# Patient Record
Sex: Male | Born: 1970
Health system: Southern US, Community
[De-identification: ages and names within clinical notes are randomized; demographics above are authoritative.]

## PROBLEM LIST (undated history)

## (undated) DIAGNOSIS — I10 Essential (primary) hypertension: Secondary | ICD-10-CM

## (undated) HISTORY — PX: OTHER SURGICAL HISTORY: SHX169

---

## 2011-10-02 ENCOUNTER — Emergency Department (INDEPENDENT_AMBULATORY_CARE_PROVIDER_SITE_OTHER): Payer: Self-pay

## 2011-10-02 ENCOUNTER — Emergency Department (HOSPITAL_BASED_OUTPATIENT_CLINIC_OR_DEPARTMENT_OTHER)
Admission: EM | Admit: 2011-10-02 | Discharge: 2011-10-02 | Disposition: A | Discharge status: Home / Self Care | Payer: Self-pay | Attending: Emergency Medicine | Admitting: Emergency Medicine

## 2011-10-02 ENCOUNTER — Other Ambulatory Visit: Payer: Self-pay

## 2011-10-02 ENCOUNTER — Encounter (HOSPITAL_BASED_OUTPATIENT_CLINIC_OR_DEPARTMENT_OTHER): Payer: Self-pay

## 2011-10-02 DIAGNOSIS — M79643 Pain in unspecified hand: Secondary | ICD-10-CM

## 2011-10-02 DIAGNOSIS — M25649 Stiffness of unspecified hand, not elsewhere classified: Secondary | ICD-10-CM | POA: Insufficient documentation

## 2011-10-02 DIAGNOSIS — R209 Unspecified disturbances of skin sensation: Secondary | ICD-10-CM

## 2011-10-02 DIAGNOSIS — F17200 Nicotine dependence, unspecified, uncomplicated: Secondary | ICD-10-CM | POA: Insufficient documentation

## 2011-10-02 DIAGNOSIS — M79609 Pain in unspecified limb: Secondary | ICD-10-CM | POA: Insufficient documentation

## 2011-10-02 DIAGNOSIS — R079 Chest pain, unspecified: Secondary | ICD-10-CM | POA: Insufficient documentation

## 2011-10-02 HISTORY — DX: Essential (primary) hypertension: I10

## 2011-10-02 MED ORDER — OXYCODONE-ACETAMINOPHEN 5-325 MG PO TABS: 1.0000 | ORAL_TABLET | ORAL | Status: AC | PRN

## 2011-10-02 MED ORDER — OXYCODONE-ACETAMINOPHEN 5-325 MG PO TABS
1.0000 | ORAL_TABLET | Freq: Once | ORAL | Status: AC
  Administered 2011-10-02: 1 via ORAL
  Filled 2011-10-02: qty 1

## 2011-10-02 NOTE — ED Notes (Signed)
EKG given to Dr. Deretha Emory; per MD patient moved to lobby to wait.

## 2011-10-02 NOTE — ED Notes (Signed)
C/o CP with numbness to both hands "for a while" approx 1 month

## 2011-10-02 NOTE — ED Provider Notes (Signed)
This chart was scribed for Austin Gaskins, MD by Wallis Mart. The patient was seen in room MH08/MH08 and the patient's care was started at 3:18 PM.   CSN: [id number]  Arrival date & time 10/02/11  1341   First MD Initiated Contact with Patient 10/02/11 1504      Chief Complaint  Patient presents with  . Numbness  . Chest Pain     HPI Austin Mcgee is a 41 y.o. male who presents to the Emergency Department complaining persistence of constant, gradually worsening, moderate chest pain onset a week ago. The pain radiates nowhere.  Pt states that lying down improves the cp.  Waking up worsens the cp.  Pt also complains that his fingers on both hands are numb and stiff when he wakes up.  Denies fevers, SOB, abdominal pain.  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 Denies numbness and tingling in feet.  There are no other associated symptoms and no other alleviating or aggravating factors.  CP is not pleuritic   Past Medical History  Diagnosis Date  . Hypertension     History reviewed. No pertinent past surgical history.  History reviewed. No pertinent family history.  History  Substance Use Topics  . Smoking status: Current Everyday Smoker  . Smokeless tobacco: Not on file  . Alcohol Use: Yes      Review of Systems  Constitutional: Positive for diaphoresis. Negative for fever and chills.  HENT: Negative for rhinorrhea and neck pain.   Eyes: Negative for pain.  Respiratory: Negative for cough and shortness of breath.   Cardiovascular: Positive for chest pain.  Gastrointestinal: Negative for nausea, vomiting, abdominal pain and diarrhea.  Genitourinary: Negative for dysuria.  Musculoskeletal: Negative for back pain.  Skin: Negative for rash.  Neurological: Positive for numbness. Negative for dizziness and weakness.   10 Systems reviewed and are negative for acute change except as noted in the HPI.  Allergies  Review of patient's allergies indicates no known  allergies.  Home Medications  No current outpatient prescriptions on file.  BP 140/99  Pulse 63  Temp(Src) 98.4 F (36.9 C) (Oral)  Resp 20  Ht 5\' 7"  (1.702 m)  Wt 200 lb (90.719 kg)  BMI 31.32 kg/m2  SpO2 99%  Physical Exam CONSTITUTIONAL: Well developed/well nourished HEAD AND FACE: Normocephalic/atraumatic EYES: EOMI/PERRL ENMT: Mucous membranes moist NECK: supple no meningeal signs SPINE:entire spine nontender CV: S1/S2 noted, no murmurs/rubs/gallops noted LUNGS: Lungs are clear to auscultation bilaterally, no apparent distress ABDOMEN: soft, nontender, no rebound or guarding GU:no cva tenderness NEURO: Pt is awake/alert, moves all extremitiesx4 EXTREMITIES: pulses normal, full ROM, pain and numbness in both hands  worsened by hyperflexion of each wrist, distal cap refill brisk in both hands No erythema/abscess/edema noted to any of the extremities SKIN: warm, color normal PSYCH: no abnormalities of mood noted  ED Course  Procedures  DIAGNOSTIC STUDIES: Oxygen Saturation is 95% on room air, adequate by my interpretation.    COORDINATION OF CARE:   3:59 PM For hand numbness, likely work related, will give hand referral, as pain/numbness reproduced on exam with hyperflexion of each wrist For CP, doubt ACS/PE/Dissection at this time Advised close f/u for HTN Doubt myocarditis/pericarditis at this time  4:10 PM: EDP at bedside. All results reviewed and discussed with pt, questions answered, pt agreeable with plan.    MDM  Nursing notes reviewed and considered in documentation xrays reviewed and considered     Date: 10/02/2011  Rate: 59  Rhythm: normal sinus rhythm  QRS Axis: normal  Intervals: normal  ST/T Wave abnormalities: normal  Conduction Disutrbances:none  Narrative Interpretation:   Old EKG Reviewed: none available    I personally performed the services described in this documentation, which was scribed in my presence. The recorded  information has been reviewed and considered.         Austin Gaskins, MD 10/02/11 [phone]

## 2011-10-02 NOTE — Discharge Instructions (Signed)

## 2019-11-16 ENCOUNTER — Encounter (HOSPITAL_COMMUNITY): Payer: Self-pay | Admitting: Emergency Medicine

## 2019-11-16 ENCOUNTER — Inpatient Hospital Stay (HOSPITAL_COMMUNITY): Payer: Self-pay

## 2019-11-16 ENCOUNTER — Inpatient Hospital Stay (HOSPITAL_COMMUNITY)
Admission: EM | Admit: 2019-11-16 | Discharge: 2019-11-18 | DRG: 280 | Discharge status: Against Medical Advice | Payer: Self-pay | Attending: Family Medicine | Admitting: Family Medicine

## 2019-11-16 ENCOUNTER — Other Ambulatory Visit: Payer: Self-pay

## 2019-11-16 ENCOUNTER — Emergency Department (HOSPITAL_COMMUNITY): Payer: Self-pay

## 2019-11-16 DIAGNOSIS — I428 Other cardiomyopathies: Secondary | ICD-10-CM | POA: Diagnosis present

## 2019-11-16 DIAGNOSIS — T465X6A Underdosing of other antihypertensive drugs, initial encounter: Secondary | ICD-10-CM | POA: Diagnosis present

## 2019-11-16 DIAGNOSIS — F1020 Alcohol dependence, uncomplicated: Secondary | ICD-10-CM | POA: Diagnosis present

## 2019-11-16 DIAGNOSIS — T588X1A Toxic effect of carbon monoxide from other source, accidental (unintentional), initial encounter: Secondary | ICD-10-CM | POA: Diagnosis present

## 2019-11-16 DIAGNOSIS — I214 Non-ST elevation (NSTEMI) myocardial infarction: Principal | ICD-10-CM | POA: Diagnosis present

## 2019-11-16 DIAGNOSIS — Z8249 Family history of ischemic heart disease and other diseases of the circulatory system: Secondary | ICD-10-CM

## 2019-11-16 DIAGNOSIS — R0602 Shortness of breath: Secondary | ICD-10-CM

## 2019-11-16 DIAGNOSIS — I509 Heart failure, unspecified: Secondary | ICD-10-CM

## 2019-11-16 DIAGNOSIS — R0682 Tachypnea, not elsewhere classified: Secondary | ICD-10-CM | POA: Diagnosis present

## 2019-11-16 DIAGNOSIS — I110 Hypertensive heart disease with heart failure: Secondary | ICD-10-CM | POA: Diagnosis present

## 2019-11-16 DIAGNOSIS — F17200 Nicotine dependence, unspecified, uncomplicated: Secondary | ICD-10-CM | POA: Diagnosis present

## 2019-11-16 DIAGNOSIS — I10 Essential (primary) hypertension: Secondary | ICD-10-CM | POA: Diagnosis present

## 2019-11-16 DIAGNOSIS — Z720 Tobacco use: Secondary | ICD-10-CM

## 2019-11-16 DIAGNOSIS — R778 Other specified abnormalities of plasma proteins: Secondary | ICD-10-CM

## 2019-11-16 DIAGNOSIS — I5021 Acute systolic (congestive) heart failure: Secondary | ICD-10-CM | POA: Diagnosis present

## 2019-11-16 DIAGNOSIS — Y92009 Unspecified place in unspecified non-institutional (private) residence as the place of occurrence of the external cause: Secondary | ICD-10-CM

## 2019-11-16 DIAGNOSIS — Z7982 Long term (current) use of aspirin: Secondary | ICD-10-CM

## 2019-11-16 DIAGNOSIS — F17210 Nicotine dependence, cigarettes, uncomplicated: Secondary | ICD-10-CM | POA: Diagnosis present

## 2019-11-16 DIAGNOSIS — Z9114 Patient's other noncompliance with medication regimen: Secondary | ICD-10-CM

## 2019-11-16 DIAGNOSIS — Z20822 Contact with and (suspected) exposure to covid-19: Secondary | ICD-10-CM | POA: Diagnosis present

## 2019-11-16 DIAGNOSIS — Z5329 Procedure and treatment not carried out because of patient's decision for other reasons: Secondary | ICD-10-CM | POA: Diagnosis present

## 2019-11-16 DIAGNOSIS — Z91128 Patient's intentional underdosing of medication regimen for other reason: Secondary | ICD-10-CM

## 2019-11-16 DIAGNOSIS — F1410 Cocaine abuse, uncomplicated: Secondary | ICD-10-CM | POA: Diagnosis present

## 2019-11-16 DIAGNOSIS — E119 Type 2 diabetes mellitus without complications: Secondary | ICD-10-CM | POA: Diagnosis present

## 2019-11-16 LAB — RAPID URINE DRUG SCREEN, HOSP PERFORMED
Amphetamines: NOT DETECTED
Barbiturates: NOT DETECTED
Benzodiazepines: NOT DETECTED
Cocaine: POSITIVE — AB
Opiates: NOT DETECTED
Tetrahydrocannabinol: NOT DETECTED

## 2019-11-16 LAB — CBC
HCT: 39.7 % (ref 39.0–52.0)
Hemoglobin: 13 g/dL (ref 13.0–17.0)
MCH: 29.1 pg (ref 26.0–34.0)
MCHC: 32.7 g/dL (ref 30.0–36.0)
MCV: 88.8 fL (ref 80.0–100.0)
Platelets: 346 10*3/uL (ref 150–400)
RBC: 4.47 MIL/uL (ref 4.22–5.81)
RDW: 15.1 % (ref 11.5–15.5)
WBC: 11.9 10*3/uL — ABNORMAL HIGH (ref 4.0–10.5)
nRBC: 0 % (ref 0.0–0.2)

## 2019-11-16 LAB — BASIC METABOLIC PANEL
Anion gap: 14 (ref 5–15)
BUN: 33 mg/dL — ABNORMAL HIGH (ref 6–20)
CO2: 21 mmol/L — ABNORMAL LOW (ref 22–32)
Calcium: 10 mg/dL (ref 8.9–10.3)
Chloride: 99 mmol/L (ref 98–111)
Creatinine, Ser: 1.42 mg/dL — ABNORMAL HIGH (ref 0.61–1.24)
GFR calc Af Amer: >60 mL/min (ref >60)
GFR calc non Af Amer: 58 mL/min — ABNORMAL LOW (ref >60)
Glucose, Bld: 123 mg/dL — ABNORMAL HIGH (ref 70–99)
Potassium: 5 mmol/L (ref 3.5–5.1)
Sodium: 134 mmol/L — ABNORMAL LOW (ref 135–145)

## 2019-11-16 LAB — URINALYSIS, ROUTINE W REFLEX MICROSCOPIC
Bilirubin Urine: NEGATIVE
Glucose, UA: NEGATIVE mg/dL
Hgb urine dipstick: NEGATIVE
Ketones, ur: NEGATIVE mg/dL
Leukocytes,Ua: NEGATIVE
Nitrite: NEGATIVE
Protein, ur: NEGATIVE mg/dL
Specific Gravity, Urine: 1.005 (ref 1.005–1.030)
pH: 6 (ref 5.0–8.0)

## 2019-11-16 LAB — POCT I-STAT 7, (LYTES, BLD GAS, ICA,H+H)
Acid-Base Excess: 4 mmol/L — ABNORMAL HIGH (ref 0.0–2.0)
Bicarbonate: 24.8 mmol/L (ref 20.0–28.0)
Calcium, Ion: 1.2 mmol/L (ref 1.15–1.40)
HCT: 39 % (ref 39.0–52.0)
Hemoglobin: 13.3 g/dL (ref 13.0–17.0)
O2 Saturation: 93 %
Patient temperature: 97.8
Potassium: 3.8 mmol/L (ref 3.5–5.1)
Sodium: 136 mmol/L (ref 135–145)
TCO2: 26 mmol/L (ref 22–32)
pCO2 arterial: 24.5 mmHg — ABNORMAL LOW (ref 32.0–48.0)
pH, Arterial: 7.613 (ref 7.350–7.450)
pO2, Arterial: 51 mmHg — ABNORMAL LOW (ref 83.0–108.0)

## 2019-11-16 LAB — TROPONIN I (HIGH SENSITIVITY)
Troponin I (High Sensitivity): 400 ng/L (ref <18)
Troponin I (High Sensitivity): 404 ng/L (ref <18)

## 2019-11-16 LAB — ECHOCARDIOGRAM COMPLETE
Height: 67.5 in
Weight: 2880 oz

## 2019-11-16 LAB — RESPIRATORY PANEL BY RT PCR (FLU A&B, COVID)
Influenza A by PCR: NEGATIVE
Influenza B by PCR: NEGATIVE
SARS Coronavirus 2 by RT PCR: NEGATIVE

## 2019-11-16 LAB — BRAIN NATRIURETIC PEPTIDE: B Natriuretic Peptide: 566.4 pg/mL — ABNORMAL HIGH (ref 0.0–100.0)

## 2019-11-16 LAB — CARBOXYHEMOGLOBIN - COOX: Carboxyhemoglobin: 4.7 % — ABNORMAL HIGH (ref 0.5–1.5)

## 2019-11-16 MED ORDER — ASPIRIN 81 MG PO CHEW
324.0000 mg | CHEWABLE_TABLET | Freq: Once | ORAL | Status: AC
  Administered 2019-11-16: 11:00:00 324 mg via ORAL
  Filled 2019-11-16: qty 4

## 2019-11-16 MED ORDER — LORAZEPAM 2 MG/ML IJ SOLN: 1.0000 mg | INTRAMUSCULAR | Status: DC | PRN

## 2019-11-16 MED ORDER — ATORVASTATIN CALCIUM 40 MG PO TABS
40.0000 mg | ORAL_TABLET | Freq: Every day | ORAL | Status: DC
  Administered 2019-11-16: 40 mg via ORAL
  Filled 2019-11-16: qty 1

## 2019-11-16 MED ORDER — FOLIC ACID 1 MG PO TABS
1.0000 mg | ORAL_TABLET | Freq: Every day | ORAL | Status: DC
  Administered 2019-11-16 – 2019-11-17 (×2): 1 mg via ORAL
  Filled 2019-11-16 (×2): qty 1

## 2019-11-16 MED ORDER — BISACODYL 5 MG PO TBEC: 5.0000 mg | DELAYED_RELEASE_TABLET | Freq: Every day | ORAL | Status: DC | PRN

## 2019-11-16 MED ORDER — THIAMINE HCL 100 MG/ML IJ SOLN: 100.0000 mg | Freq: Every day | INTRAMUSCULAR | Status: DC

## 2019-11-16 MED ORDER — ONDANSETRON HCL 4 MG/2ML IJ SOLN: 4.0000 mg | Freq: Four times a day (QID) | INTRAMUSCULAR | Status: DC | PRN

## 2019-11-16 MED ORDER — NITROGLYCERIN 0.4 MG SL SUBL: 0.4000 mg | SUBLINGUAL_TABLET | SUBLINGUAL | Status: DC | PRN

## 2019-11-16 MED ORDER — SODIUM CHLORIDE 0.9% FLUSH: 3.0000 mL | Freq: Once | INTRAVENOUS | Status: DC

## 2019-11-16 MED ORDER — SODIUM CHLORIDE 0.9 % IV SOLN: 250.0000 mL | INTRAVENOUS | Status: DC | PRN

## 2019-11-16 MED ORDER — THIAMINE HCL 100 MG PO TABS
100.0000 mg | ORAL_TABLET | Freq: Every day | ORAL | Status: DC
  Administered 2019-11-16: 100 mg via ORAL
  Filled 2019-11-16: qty 1

## 2019-11-16 MED ORDER — SODIUM CHLORIDE 0.9% FLUSH: 3.0000 mL | Freq: Two times a day (BID) | INTRAVENOUS | Status: DC

## 2019-11-16 MED ORDER — HEPARIN (PORCINE) 25000 UT/250ML-% IV SOLN
1450.0000 [IU]/h | INTRAVENOUS | Status: DC
  Administered 2019-11-16: 1000 [IU]/h via INTRAVENOUS
  Administered 2019-11-17: 1300 [IU]/h via INTRAVENOUS
  Filled 2019-11-16 (×2): qty 250

## 2019-11-16 MED ORDER — HEPARIN BOLUS VIA INFUSION
4000.0000 [IU] | Freq: Once | INTRAVENOUS | Status: AC
  Administered 2019-11-16: 16:00:00 4000 [IU] via INTRAVENOUS
  Filled 2019-11-16: qty 4000

## 2019-11-16 MED ORDER — ONDANSETRON HCL 4 MG PO TABS: 4.0000 mg | ORAL_TABLET | Freq: Four times a day (QID) | ORAL | Status: DC | PRN

## 2019-11-16 MED ORDER — MORPHINE SULFATE (PF) 2 MG/ML IV SOLN: 2.0000 mg | INTRAVENOUS | Status: DC | PRN

## 2019-11-16 MED ORDER — HYDRALAZINE HCL 20 MG/ML IJ SOLN: 5.0000 mg | INTRAMUSCULAR | Status: DC | PRN

## 2019-11-16 MED ORDER — SODIUM CHLORIDE 0.9% FLUSH: 3.0000 mL | INTRAVENOUS | Status: DC | PRN

## 2019-11-16 MED ORDER — ACETAMINOPHEN 650 MG RE SUPP: 650.0000 mg | Freq: Four times a day (QID) | RECTAL | Status: DC | PRN

## 2019-11-16 MED ORDER — LORAZEPAM 2 MG/ML IJ SOLN
0.5000 mg | Freq: Once | INTRAMUSCULAR | Status: AC
  Administered 2019-11-16: 0.5 mg via INTRAVENOUS
  Filled 2019-11-16: qty 1

## 2019-11-16 MED ORDER — FUROSEMIDE 10 MG/ML IJ SOLN
40.0000 mg | Freq: Once | INTRAMUSCULAR | Status: AC
  Administered 2019-11-16: 40 mg via INTRAVENOUS
  Filled 2019-11-16: qty 4

## 2019-11-16 MED ORDER — ASPIRIN EC 81 MG PO TBEC
81.0000 mg | DELAYED_RELEASE_TABLET | Freq: Every day | ORAL | Status: DC
  Administered 2019-11-17: 81 mg via ORAL
  Filled 2019-11-16: qty 1

## 2019-11-16 MED ORDER — NICOTINE 14 MG/24HR TD PT24
14.0000 mg | MEDICATED_PATCH | Freq: Every day | TRANSDERMAL | Status: DC
  Administered 2019-11-17: 13:00:00 14 mg via TRANSDERMAL
  Filled 2019-11-16: qty 1

## 2019-11-16 MED ORDER — POLYETHYLENE GLYCOL 3350 17 G PO PACK: 17.0000 g | PACK | Freq: Every day | ORAL | Status: DC | PRN

## 2019-11-16 MED ORDER — ACETAMINOPHEN 325 MG PO TABS: 650.0000 mg | ORAL_TABLET | Freq: Four times a day (QID) | ORAL | Status: DC | PRN

## 2019-11-16 MED ORDER — LORAZEPAM 1 MG PO TABS
1.0000 mg | ORAL_TABLET | ORAL | Status: DC | PRN
  Administered 2019-11-17: 1 mg via ORAL
  Administered 2019-11-18: 2 mg via ORAL
  Filled 2019-11-16: qty 2
  Filled 2019-11-16: qty 1

## 2019-11-16 MED ORDER — DOCUSATE SODIUM 100 MG PO CAPS: 100.0000 mg | ORAL_CAPSULE | Freq: Two times a day (BID) | ORAL | Status: DC

## 2019-11-16 MED ORDER — HYDROCODONE-ACETAMINOPHEN 5-325 MG PO TABS
1.0000 | ORAL_TABLET | ORAL | Status: DC | PRN
  Administered 2019-11-17 – 2019-11-18 (×2): 2 via ORAL
  Filled 2019-11-16 (×2): qty 2

## 2019-11-16 MED ORDER — ADULT MULTIVITAMIN W/MINERALS CH
1.0000 | ORAL_TABLET | Freq: Every day | ORAL | Status: DC
  Administered 2019-11-16 – 2019-11-17 (×2): 1 via ORAL
  Filled 2019-11-16 (×2): qty 1

## 2019-11-16 NOTE — ED Provider Notes (Signed)
[facility] EMERGENCY DEPARTMENT Provider Note   CSN: [id number] Arrival date & time: 11/16/19  5035     History Chief Complaint  Patient presents with  . Shortness of Breath  . Chest Pain    Austin Mcgee is a 49 y.o. male.  Pt presents to the ED today with sob and cp.  The pt said sx started last night.  The pt said he's been painting his house.  He has been very anxious.  Pt does not have a CAD hx.  He does have htn, but does not take meds.  He does smoke.  No covid exposures.  No covid vaccine.         Past Medical History:  Diagnosis Date  . Hypertension     Patient Active Problem List   Diagnosis Date Noted  . NSTEMI (non-ST elevated myocardial infarction) (HCC) 11/16/2019    History reviewed. No pertinent surgical history.     No family history on file.  Social History   Tobacco Use  . Smoking status: Current Every Day Smoker  Substance Use Topics  . Alcohol use: Yes  . Drug use: Yes    Types: Marijuana    Home Medications Prior to Admission medications   Medication Sig Start Date End Date Taking? Authorizing Provider  aspirin 325 MG tablet Take 325 mg by mouth daily. Patient used this medication for pain.    [provider]  doxylamine, Sleep, (UNISOM) 25 MG tablet Take 25 mg by mouth at bedtime as needed. Patient used this medication to aid with sleeping.    [provider]    Allergies    Patient has no known allergies.  Review of Systems   Review of Systems  Respiratory: Positive for shortness of breath.   Cardiovascular: Positive for chest pain.  All other systems reviewed and are negative.   Physical Exam Updated Vital Signs BP (!) 121/106 (BP Location: Right Arm) Comment: pt refused to keep arm still  Pulse 98   Temp 97.8 F (36.6 C) (Axillary)   Resp (!) 25   Ht 5' 7.5" (1.715 m)   Wt 81.6 kg   SpO2 99%   BMI 27.78 kg/m   Physical Exam Vitals and nursing note reviewed.    Constitutional:      Appearance: He is well-developed.  HENT:     Head: Normocephalic and atraumatic.     Mouth/Throat:     Mouth: Mucous membranes are moist.     Pharynx: Oropharynx is clear.  Eyes:     Extraocular Movements: Extraocular movements intact.     Pupils: Pupils are equal, round, and reactive to light.  Cardiovascular:     Rate and Rhythm: Normal rate and regular rhythm.  Pulmonary:     Effort: Tachypnea present.     Breath sounds: Rhonchi present.  Abdominal:     General: Bowel sounds are normal.     Palpations: Abdomen is soft.  Musculoskeletal:     Cervical back: Normal range of motion and neck supple.     Right lower leg: Edema present.     Left lower leg: Edema present.  Skin:    General: Skin is warm.     Capillary Refill: Capillary refill takes less than 2 seconds.  Neurological:     General: No focal deficit present.     Mental Status: He is alert and oriented to person, place, and time.  Psychiatric:        Mood and Affect:  Mood is anxious.        Behavior: Behavior normal.     ED Results / Procedures / Treatments   Labs (all labs ordered are listed, but only abnormal results are displayed) Labs Reviewed  BASIC METABOLIC PANEL - Abnormal; Notable for the following components:      Result Value   Sodium 134 (*)    CO2 21 (*)    Glucose, Bld 123 (*)    BUN 33 (*)    Creatinine, Ser 1.42 (*)    GFR calc non Af Amer 58 (*)    All other components within normal limits  CBC - Abnormal; Notable for the following components:   WBC 11.9 (*)    All other components within normal limits  BRAIN NATRIURETIC PEPTIDE - Abnormal; Notable for the following components:   B Natriuretic Peptide 566.4 (*)    All other components within normal limits  TROPONIN I (HIGH SENSITIVITY) - Abnormal; Notable for the following components:   Troponin I (High Sensitivity) 400 (*)    All other components within normal limits  TROPONIN I (HIGH SENSITIVITY) - Abnormal;  Notable for the following components:   Troponin I (High Sensitivity) 404 (*)    All other components within normal limits  RESPIRATORY PANEL BY RT PCR (FLU A&B, COVID)  RAPID URINE DRUG SCREEN, HOSP PERFORMED  URINALYSIS, ROUTINE W REFLEX MICROSCOPIC    EKG EKG Interpretation  Date/Time:  Sunday November 16 2019 08:55:15 EDT Ventricular Rate:  84 PR Interval:  150 QRS Duration: 98 QT Interval:  402 QTC Calculation: 475 R Axis:   80 Text Interpretation: Normal sinus rhythm Minimal voltage criteria for LVH, may be normal variant ( Sokolow-Lyon ) Borderline ECG No significant change since last tracing Confirmed by Jacalyn Lefevre [phone]) on 11/16/2019 10:46:37 AM   Radiology DG Chest 2 View  Result Date: 11/16/2019 CLINICAL DATA:  Chest pain, shortness of breath for 1 hour. EXAM: CHEST - 2 VIEW COMPARISON:  01/12/2019 FINDINGS: Cardiomediastinal contours are enlarged. Central pulmonary vascular engorgement. Increased interstitial markings bilaterally. Subtle basilar opacities bilaterally. No signs of pleural effusion. Visualized skeletal structures are unremarkable. IMPRESSION: Findings suspicious for heart failure or volume overload. Patchy basilar opacities may represent atelectasis or developing infection. Electronically Signed   By: Donzetta Kohut M.D.   On: 11/16/2019 10:11    Procedures Procedures (including critical care time)  Medications Ordered in ED Medications  sodium chloride flush (NS) 0.9 % injection 3 mL (3 mLs Intravenous Not Given 11/16/19 1121)  nitroGLYCERIN (NITROSTAT) SL tablet 0.4 mg (has no administration in time range)  aspirin chewable tablet 324 mg (324 mg Oral Given 11/16/19 1121)  furosemide (LASIX) injection 40 mg (40 mg Intravenous Given 11/16/19 1130)  LORazepam (ATIVAN) injection 0.5 mg (0.5 mg Intravenous Given 11/16/19 1128)    ED Course  I have reviewed the triage vital signs and the nursing notes.  Pertinent labs & imaging results that were  available during my care of the patient were reviewed by me and considered in my medical decision making (see chart for details).    MDM Rules/Calculators/A&P                     CP is gone after the nitro sl.  Pt is much more calm after the ativan.  Lasix has helped the sob.  No hx of CHF.  Noncompliant with bp meds due to no pcp.  Pt encouraged to stop smoking.  Pt d/w Dr. Ladona Ridgel (  cards) who said pt does not need an emergent cath.  He will see pt in consult.  1st troponin is elevated, but the 2nd troponin is not going up.  It is stable.  Pt d/w Dr. Lorin Mercy (triad) for admission.  CRITICAL CARE Performed by: Isla Pence   Total critical care time: 30 minutes  Critical care time was exclusive of separately billable procedures and treating other patients.  Critical care was necessary to treat or prevent imminent or life-threatening deterioration.  Critical care was time spent personally by me on the following activities: development of treatment plan with patient and/or surrogate as well as nursing, discussions with consultants, evaluation of patient's response to treatment, examination of patient, obtaining history from patient or surrogate, ordering and performing treatments and interventions, ordering and review of laboratory studies, ordering and review of radiographic studies, pulse oximetry and re-evaluation of patient's condition.  Final Clinical Impression(s) / ED Diagnoses Final diagnoses:  Acute congestive heart failure, unspecified heart failure type ([place])  Essential hypertension  Tobacco abuse  Elevated troponin    Rx / DC Orders ED Discharge Orders    None       Isla Pence, MD 11/16/19 1433

## 2019-11-16 NOTE — ED Notes (Signed)
Spouse remains at bedside.  

## 2019-11-16 NOTE — ED Notes (Signed)
Spouse talking w/Dr Ophelia Charter on phone.
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
## 2019-11-16 NOTE — Progress Notes (Signed)
  Echocardiogram 2D Echocardiogram has been performed.  Austin Mcgee 11/16/2019, 3:36 PM

## 2019-11-16 NOTE — Progress Notes (Signed)
ANTICOAGULATION CONSULT NOTE - Initial Consult  Pharmacy Consult for heparin Indication: chest pain/ACS  No Known Allergies  Patient Measurements: Height: 5' 7.5" (171.5 cm) Weight: 81.6 kg (180 lb) IBW/kg (Calculated) : 67.25  Vital Signs: Temp: 97.8 F (36.6 C) (04/25 0903) Temp Source: Axillary (04/25 0903) BP: 121/106 (04/25 0903) Pulse Rate: 98 (04/25 0903)  Labs: Recent Labs    11/16/19 0913 11/16/19 1140  HGB 13.0  --   HCT 39.7  --   PLT 346  --   CREATININE 1.42*  --   TROPONINIHS 400* 404*    Estimated Creatinine Clearance: 65 mL/min (A) (by C-G formula based on SCr of 1.42 mg/dL (H)).   Medical History: Past Medical History:  Diagnosis Date  . Hypertension    no medication in years, has not seen a doctor in years    Medications:  See med hx  Assessment: 39 yom who presented to the ED with chest pain. Troponin 400 and 404. SCr elevated at 1.42 (unknown baseline). Pharmacy consulted to begin heparin drip. No bleeding noted, CBC is normal. No AC PTA.  Goal of Therapy:  Heparin level 0.3-0.7 units/ml Monitor platelets by anticoagulation protocol: Yes   Plan:  Heparin 4000 units IV bolus then infuse at 1000 units/hr 6h heparin level Daily heparin level and CBC Monitor for s/sx of bleeding  Thank you for involving pharmacy in this patient's care.  Loura Back, PharmD, BCPS Clinical Pharmacist Clinical phone for 11/16/2019 until 3p is [phone] 11/16/2019 3:10 PM  **Pharmacist phone directory can be found on [email or web address] listed under [facility]**

## 2019-11-16 NOTE — ED Triage Notes (Signed)
Pt to triage via [facility].  Pt hyperventilating on arrival. Reports SOB x 1 hour.  He has been painting his house the last few days.  Also reports SOB last night that resolved.  Pt reports tightness to center of chest.  Pt extremely anxious.  Encouraged pt to concentrate on slowing breathing down.  O2 100% on arrival.

## 2019-11-16 NOTE — ED Notes (Signed)
Pt's spouse has arrived and is asking to see pt's dr - Message sent to Dr Ophelia Charter w/her cell number.

## 2019-11-16 NOTE — ED Notes (Signed)
Charge RN and Dr. Particia Nearing notified of Trop 400.  Pt in waiting room and being moved to treatment room.
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
## 2019-11-16 NOTE — ED Notes (Signed)
Per Dr Ophelia Charter, pt w/carbon monoxide poisoning - Non-rebreather applied. Pt aware must wear continuously until advised may remove.

## 2019-11-17 ENCOUNTER — Encounter (HOSPITAL_COMMUNITY)
Admission: EM | Discharge status: Against Medical Advice | Payer: Self-pay | Source: Home / Self Care | Attending: Family Medicine

## 2019-11-17 DIAGNOSIS — F1410 Cocaine abuse, uncomplicated: Secondary | ICD-10-CM

## 2019-11-17 DIAGNOSIS — I5021 Acute systolic (congestive) heart failure: Secondary | ICD-10-CM

## 2019-11-17 HISTORY — PX: LEFT HEART CATH AND CORONARY ANGIOGRAPHY: CATH118249

## 2019-11-17 LAB — BASIC METABOLIC PANEL
Anion gap: 11 (ref 5–15)
BUN: 26 mg/dL — ABNORMAL HIGH (ref 6–20)
CO2: 24 mmol/L (ref 22–32)
Calcium: 9.3 mg/dL (ref 8.9–10.3)
Chloride: 101 mmol/L (ref 98–111)
Creatinine, Ser: 1.42 mg/dL — ABNORMAL HIGH (ref 0.61–1.24)
GFR calc Af Amer: >60 mL/min (ref >60)
GFR calc non Af Amer: 58 mL/min — ABNORMAL LOW (ref >60)
Glucose, Bld: 195 mg/dL — ABNORMAL HIGH (ref 70–99)
Potassium: 3.7 mmol/L (ref 3.5–5.1)
Sodium: 136 mmol/L (ref 135–145)

## 2019-11-17 LAB — LIPID PANEL
Cholesterol: 156 mg/dL (ref 0–200)
HDL: 31 mg/dL — ABNORMAL LOW (ref >40)
LDL Cholesterol: 110 mg/dL — ABNORMAL HIGH (ref 0–99)
Total CHOL/HDL Ratio: 5 RATIO
Triglycerides: 74 mg/dL (ref <150)
VLDL: 15 mg/dL (ref 0–40)

## 2019-11-17 LAB — TSH: TSH: 1.109 u[IU]/mL (ref 0.350–4.500)

## 2019-11-17 LAB — CBC
HCT: 39.6 % (ref 39.0–52.0)
Hemoglobin: 13 g/dL (ref 13.0–17.0)
MCH: 29.4 pg (ref 26.0–34.0)
MCHC: 32.8 g/dL (ref 30.0–36.0)
MCV: 89.6 fL (ref 80.0–100.0)
Platelets: 318 10*3/uL (ref 150–400)
RBC: 4.42 MIL/uL (ref 4.22–5.81)
RDW: 15 % (ref 11.5–15.5)
WBC: 7 10*3/uL (ref 4.0–10.5)
nRBC: 0 % (ref 0.0–0.2)

## 2019-11-17 LAB — HEPARIN LEVEL (UNFRACTIONATED)
Heparin Unfractionated: <0.1 IU/mL — ABNORMAL LOW (ref 0.30–0.70)
Heparin Unfractionated: 0.14 IU/mL — ABNORMAL LOW (ref 0.30–0.70)

## 2019-11-17 LAB — PROTIME-INR
INR: 1.2 (ref 0.8–1.2)
Prothrombin Time: 15.2 seconds (ref 11.4–15.2)

## 2019-11-17 LAB — HEMOGLOBIN A1C
Hgb A1c MFr Bld: 6.6 % — ABNORMAL HIGH (ref 4.8–5.6)
Mean Plasma Glucose: 142.72 mg/dL

## 2019-11-17 LAB — HIV ANTIBODY (ROUTINE TESTING W REFLEX): HIV Screen 4th Generation wRfx: NONREACTIVE

## 2019-11-17 LAB — GLUCOSE, CAPILLARY: Glucose-Capillary: 145 mg/dL — ABNORMAL HIGH (ref 70–99)

## 2019-11-17 SURGERY — LEFT HEART CATH AND CORONARY ANGIOGRAPHY
Anesthesia: LOCAL

## 2019-11-17 MED ORDER — SODIUM CHLORIDE 0.9 % IV SOLN: INTRAVENOUS | Status: AC

## 2019-11-17 MED ORDER — HEPARIN SODIUM (PORCINE) 1000 UNIT/ML IJ SOLN
INTRAMUSCULAR | Status: DC | PRN
  Administered 2019-11-17: 4000 [IU] via INTRAVENOUS

## 2019-11-17 MED ORDER — VERAPAMIL HCL 2.5 MG/ML IV SOLN: INTRA_ARTERIAL | Status: DC | PRN

## 2019-11-17 MED ORDER — FENTANYL CITRATE (PF) 100 MCG/2ML IJ SOLN
INTRAMUSCULAR | Status: DC | PRN
  Administered 2019-11-17: 25 ug via INTRAVENOUS

## 2019-11-17 MED ORDER — MIDAZOLAM HCL 2 MG/2ML IJ SOLN
INTRAMUSCULAR | Status: AC
  Filled 2019-11-17: qty 2

## 2019-11-17 MED ORDER — HEPARIN (PORCINE) IN NACL 1000-0.9 UT/500ML-% IV SOLN
INTRAVENOUS | Status: AC
  Filled 2019-11-17: qty 1000

## 2019-11-17 MED ORDER — ASPIRIN 81 MG PO CHEW: 81.0000 mg | CHEWABLE_TABLET | ORAL | Status: DC

## 2019-11-17 MED ORDER — SODIUM CHLORIDE 0.9 % IV SOLN: 250.0000 mL | INTRAVENOUS | Status: DC | PRN

## 2019-11-17 MED ORDER — HEPARIN BOLUS VIA INFUSION
2000.0000 [IU] | Freq: Once | INTRAVENOUS | Status: AC
  Administered 2019-11-17: 16:00:00 2000 [IU] via INTRAVENOUS
  Filled 2019-11-17: qty 2000

## 2019-11-17 MED ORDER — HEPARIN (PORCINE) IN NACL 1000-0.9 UT/500ML-% IV SOLN
INTRAVENOUS | Status: DC | PRN
  Administered 2019-11-17 (×2): 500 mL

## 2019-11-17 MED ORDER — HYDRALAZINE HCL 20 MG/ML IJ SOLN: 10.0000 mg | INTRAMUSCULAR | Status: AC | PRN

## 2019-11-17 MED ORDER — HEPARIN SODIUM (PORCINE) 1000 UNIT/ML IJ SOLN
INTRAMUSCULAR | Status: AC
  Filled 2019-11-17: qty 1

## 2019-11-17 MED ORDER — SODIUM CHLORIDE 0.9 % IV SOLN: INTRAVENOUS | Status: DC

## 2019-11-17 MED ORDER — LIDOCAINE HCL (PF) 1 % IJ SOLN
INTRAMUSCULAR | Status: AC
  Filled 2019-11-17: qty 30

## 2019-11-17 MED ORDER — HEPARIN BOLUS VIA INFUSION
2000.0000 [IU] | Freq: Once | INTRAVENOUS | Status: AC
  Administered 2019-11-17: 2000 [IU] via INTRAVENOUS
  Filled 2019-11-17: qty 2000

## 2019-11-17 MED ORDER — SODIUM CHLORIDE 0.9% FLUSH: 3.0000 mL | Freq: Two times a day (BID) | INTRAVENOUS | Status: DC

## 2019-11-17 MED ORDER — NITROGLYCERIN 1 MG/10 ML FOR IR/CATH LAB
INTRA_ARTERIAL | Status: AC
  Filled 2019-11-17: qty 10

## 2019-11-17 MED ORDER — ACETAMINOPHEN 325 MG PO TABS: 650.0000 mg | ORAL_TABLET | ORAL | Status: DC | PRN

## 2019-11-17 MED ORDER — IOHEXOL 350 MG/ML SOLN
INTRAVENOUS | Status: DC | PRN
  Administered 2019-11-17: 50 mL

## 2019-11-17 MED ORDER — FENTANYL CITRATE (PF) 100 MCG/2ML IJ SOLN
INTRAMUSCULAR | Status: AC
  Filled 2019-11-17: qty 2

## 2019-11-17 MED ORDER — ASPIRIN 81 MG PO CHEW
81.0000 mg | CHEWABLE_TABLET | ORAL | Status: DC
Start: 2019-11-18 — End: 2019-11-17

## 2019-11-17 MED ORDER — MORPHINE SULFATE (PF) 2 MG/ML IV SOLN: 2.0000 mg | INTRAVENOUS | Status: DC | PRN

## 2019-11-17 MED ORDER — VERAPAMIL HCL 2.5 MG/ML IV SOLN
INTRAVENOUS | Status: AC
  Filled 2019-11-17: qty 2

## 2019-11-17 MED ORDER — SODIUM CHLORIDE 0.9% FLUSH: 3.0000 mL | INTRAVENOUS | Status: DC | PRN

## 2019-11-17 MED ORDER — LABETALOL HCL 5 MG/ML IV SOLN: 10.0000 mg | INTRAVENOUS | Status: AC | PRN

## 2019-11-17 MED ORDER — MIDAZOLAM HCL 2 MG/2ML IJ SOLN
INTRAMUSCULAR | Status: DC | PRN
  Administered 2019-11-17: 1 mg via INTRAVENOUS

## 2019-11-17 MED ORDER — INSULIN ASPART 100 UNIT/ML ~~LOC~~ SOLN: 0.0000 [IU] | Freq: Every day | SUBCUTANEOUS | Status: DC

## 2019-11-17 MED ORDER — ONDANSETRON HCL 4 MG/2ML IJ SOLN: 4.0000 mg | Freq: Four times a day (QID) | INTRAMUSCULAR | Status: DC | PRN

## 2019-11-17 MED ORDER — SODIUM CHLORIDE 0.9 % IV SOLN
INTRAVENOUS | Status: DC
Start: 2019-11-18 — End: 2019-11-17

## 2019-11-17 MED ORDER — INSULIN ASPART 100 UNIT/ML ~~LOC~~ SOLN: 0.0000 [IU] | Freq: Three times a day (TID) | SUBCUTANEOUS | Status: DC

## 2019-11-17 SURGICAL SUPPLY — 11 items
CATH INFINITI JR4 5F (CATHETERS) ×1 IMPLANT
CATH OPTITORQUE TIG 4.0 5F (CATHETERS) ×1 IMPLANT
DEVICE RAD COMP TR BAND LRG (VASCULAR PRODUCTS) ×1 IMPLANT
GLIDESHEATH SLEND A-KIT 6F 22G (SHEATH) ×1 IMPLANT
GUIDEWIRE INQWIRE 1.5J.035X260 (WIRE) IMPLANT
INQWIRE 1.5J .035X260CM (WIRE) ×2
KIT HEART LEFT (KITS) ×2 IMPLANT
PACK CARDIAC CATHETERIZATION (CUSTOM PROCEDURE TRAY) ×2 IMPLANT
TRANSDUCER W/STOPCOCK (MISCELLANEOUS) ×2 IMPLANT
TUBING CIL FLEX 10 FLL-RA (TUBING) ×2 IMPLANT
WIRE HI TORQ VERSACORE-J 145CM (WIRE) ×1 IMPLANT

## 2019-11-17 NOTE — ED Provider Notes (Signed)
RN request that I see pt.  Pt spilt hot coffee on his left arm.  Rn applied cool wet cloth.  Pt reports redness has faded and currently has no pain.   Left arm,  No blisters, no redness,  Normal sensation. Pt advised to let rn reevaluate if any pain   Austin Mcgee 11/17/19 1455    Gwyneth Sprout, MD 11/17/19 1546

## 2019-11-17 NOTE — Progress Notes (Signed)
ANTICOAGULATION CONSULT NOTE  Pharmacy Consult for heparin Indication: chest pain/ACS  No Known Allergies  Patient Measurements: Height: 5' 7.5" (171.5 cm) Weight: 81.6 kg (180 lb) IBW/kg (Calculated) : 67.25  Vital Signs: BP: 131/92 (04/26 0629) Pulse Rate: 78 (04/26 0630)  Labs: Recent Labs    11/16/19 0913 11/16/19 0913 11/16/19 1140 11/16/19 1527 11/17/19 0040 11/17/19 0929  HGB 13.0   < >  --  13.3  --  13.0  HCT 39.7  --   --  39.0  --  39.6  PLT 346  --   --   --   --  318  LABPROT  --   --   --   --   --  15.2  INR  --   --   --   --   --  1.2  HEPARINUNFRC  --   --   --   --  <0.10* 0.14*  CREATININE 1.42*  --   --   --   --  1.42*  TROPONINIHS 400*  --  404*  --   --   --    < > = values in this interval not displayed.    Estimated Creatinine Clearance: 65 mL/min (A) (by C-G formula based on SCr of 1.42 mg/dL (H)).  Assessment: 49 y.o. male with chest pain for heparin   Heparin level 0.14 which is subtherapeutic  Goal of Therapy:  Heparin level 0.3-0.7 units/ml Monitor platelets by anticoagulation protocol: Yes   Plan:  Heparin 2000 units IV bolus, then increase heparin 1450 units/hr Check heparin level in 6-8 hours.   Jeanella Cara, PharmD, [facility] Clinical Pharmacist Please see AMION for all Pharmacists' Contact Phone Numbers 11/17/2019, 1:23 PM

## 2019-11-17 NOTE — ED Notes (Signed)
Breakfast ordered 

## 2019-11-17 NOTE — H&P (View-Only) (Signed)
Progress Note  Patient Name: Austin Mcgee Date of Encounter: 11/17/2019  Primary Cardiologist: No primary care provider on file. New ([place])  Subjective   Feels much better, dyspnea and chest pain resolved.  Inpatient Medications    Scheduled Meds: . aspirin EC  81 mg Oral Daily  . atorvastatin  40 mg Oral q1800  . docusate sodium  100 mg Oral BID  . folic acid  1 mg Oral Daily  . multivitamin with minerals  1 tablet Oral Daily  . nicotine  14 mg Transdermal Daily  . sodium chloride flush  3 mL Intravenous Q12H  . thiamine  100 mg Oral Daily   Or  . thiamine  100 mg Intravenous Daily   Continuous Infusions: . sodium chloride    . heparin 1,300 Units/hr (11/17/19 0133)   PRN Meds: sodium chloride, acetaminophen **OR** acetaminophen, bisacodyl, hydrALAZINE, HYDROcodone-acetaminophen, LORazepam **OR** LORazepam, morphine injection, nitroGLYCERIN, ondansetron **OR** ondansetron (ZOFRAN) IV, polyethylene glycol, sodium chloride flush   Vital Signs    Vitals:   11/17/19 0341 11/17/19 0500 11/17/19 0629 11/17/19 0630  BP: 105/72 126/89 (!) 131/92   Pulse: 80 86 91 78  Resp: 16 13 15    Temp:      TempSrc:      SpO2: 99% 98% 100% 100%  Weight:      Height:        Intake/Output Summary (Last 24 hours) at 11/17/2019 0803 Last data filed at 11/17/2019 0035 Gross per 24 hour  Intake 120.78 ml  Output 300 ml  Net -179.22 ml   Last 3 Weights 11/16/2019 10/02/2011  Weight (lbs) 180 lb 200 lb  Weight (kg) 81.647 kg 90.719 kg      Telemetry    n/a - Personally Reviewed  ECG    NSR, voltage for LVH, no repol changes - Personally Reviewed  Physical Exam  Appears fit GEN: No acute distress.   Neck: No JVD Cardiac: RRR, no murmurs, rubs, or gallops.  Respiratory: Clear to auscultation bilaterally. GI: Soft, nontender, non-distended  MS: No edema; No deformity. Neuro:  Nonfocal  Psych: Normal affect   Labs    High Sensitivity Troponin:   Recent Labs  Lab  11/16/19 0913 11/16/19 1140  TROPONINIHS 400* 404*      Chemistry Recent Labs  Lab 11/16/19 0913 11/16/19 1527  NA 134* 136  K 5.0 3.8  CL 99  --   CO2 21*  --   GLUCOSE 123*  --   BUN 33*  --   CREATININE 1.42*  --   CALCIUM 10.0  --   GFRNONAA 58*  --   GFRAA >60  --   ANIONGAP 14  --      Hematology Recent Labs  Lab 11/16/19 0913 11/16/19 1527  WBC 11.9*  --   RBC 4.47  --   HGB 13.0 13.3  HCT 39.7 39.0  MCV 88.8  --   MCH 29.1  --   MCHC 32.7  --   RDW 15.1  --   PLT 346  --     BNP Recent Labs  Lab 11/16/19 1142  BNP 566.4*     DDimer No results for input(s): DDIMER in the last 168 hours.   Radiology    DG Chest 2 View  Result Date: 11/16/2019 CLINICAL DATA:  Chest pain, shortness of breath for 1 hour. EXAM: CHEST - 2 VIEW COMPARISON:  01/12/2019 FINDINGS: Cardiomediastinal contours are enlarged. Central pulmonary vascular engorgement. Increased interstitial markings bilaterally. Subtle  basilar opacities bilaterally. No signs of pleural effusion. Visualized skeletal structures are unremarkable. IMPRESSION: Findings suspicious for heart failure or volume overload. Patchy basilar opacities may represent atelectasis or developing infection. Electronically Signed   By: Geoffrey  Wile M.D.   On: 11/16/2019 10:11   ECHOCARDIOGRAM COMPLETE  Result Date: 11/16/2019    ECHOCARDIOGRAM REPORT   Patient Name:   Austin Mcgee Date of Exam: 11/16/2019 Medical Rec #:  [id number]    Height:       67.5 in Accession #:    [id number]   Weight:       180.0 lb Date of Birth:  08/20/1970     BSA:          1.944 m Patient Age:    49 years     BP:           124/95 mmHg Patient Gender: M            HR:           100 bpm. Exam Location:  Inpatient Procedure: 2D Echo, Cardiac Doppler and Color Doppler STAT ECHO Indications:    NSTEMI I21.4  History:        Patient has no prior history of Echocardiogram examinations.                 Arrythmias:non-specific ST changes; Risk  Factors:Current Smoker.  Sonographer:    Julia Swaim RDCS Referring Phys: [id number] JENNIFER YATES  Sonographer Comments: Refused definity. IMPRESSIONS  1. There is global left ventricular hypokinesis with disproportionately severe hypokinesis/akinesis of the mid-apical anterior, anteroseptal walls and apex. This suggests infarction in the LAD artery territory. Cannot exclude left ventricular apical thrombus (patient declined Definity contrast). Left ventricular ejection fraction, by estimation, is 25 to 30%. The left ventricle has severely decreased function. The left ventricle demonstrates regional wall motion abnormalities (see scoring diagram/findings for description). Indeterminate diastolic filling due to E-A fusion.  2. Right ventricular systolic function is normal. The right ventricular size is normal. Tricuspid regurgitation signal is inadequate for assessing PA pressure.  3. Left atrial size was mildly dilated.  4. The mitral valve is normal in structure. No evidence of mitral valve regurgitation. No evidence of mitral stenosis.  5. The aortic valve is normal in structure. Aortic valve regurgitation is not visualized. No aortic stenosis is present.  6. The inferior vena cava is dilated in size with <50% respiratory variability, suggesting right atrial pressure of 15 mmHg. FINDINGS  Left Ventricle: There is global left ventricular hypokinesis with disproportionately severe hypokinesis/akinesis of the mid-apical anterior, anteroseptal walls and apex. This suggests infarction in the LAD artery territory. Cannot exclude left ventricular apical thrombus (patient declined Definity contrast). Left ventricular ejection fraction, by estimation, is 25 to 30%. The left ventricle has severely decreased function. The left ventricle demonstrates regional wall motion abnormalities. The  left ventricular internal cavity size was normal in size. There is no left ventricular hypertrophy. Indeterminate diastolic filling due to  E-A fusion.  LV Wall Scoring: The mid and distal anterior wall, mid and distal anterior septum, apical inferior segment, and apex are akinetic. The entire lateral wall, inferior wall, basal anteroseptal segment, mid inferoseptal segment, basal anterior segment, and basal inferoseptal segment are hypokinetic. Right Ventricle: The right ventricular size is normal. No increase in right ventricular wall thickness. Right ventricular systolic function is normal. Tricuspid regurgitation signal is inadequate for assessing PA pressure. Left Atrium: Left atrial size was mildly dilated. Right Atrium: Right atrial size was normal   in size. Pericardium: There is no evidence of pericardial effusion. Mitral Valve: The mitral valve is normal in structure. Normal mobility of the mitral valve leaflets. No evidence of mitral valve regurgitation. No evidence of mitral valve stenosis. Tricuspid Valve: The tricuspid valve is normal in structure. Tricuspid valve regurgitation is trivial. No evidence of tricuspid stenosis. Aortic Valve: The aortic valve is normal in structure. Aortic valve regurgitation is not visualized. No aortic stenosis is present. Pulmonic Valve: The pulmonic valve was normal in structure. Pulmonic valve regurgitation is not visualized. No evidence of pulmonic stenosis. Aorta: The aortic root is normal in size and structure. Venous: The inferior vena cava is dilated in size with less than 50% respiratory variability, suggesting right atrial pressure of 15 mmHg. IAS/Shunts: No atrial level shunt detected by color flow Doppler.  LEFT VENTRICLE PLAX 2D LVIDd:         5.20 cm      Diastology LVIDs:         4.50 cm      LV e' lateral:   4.13 cm/s LV PW:         1.00 cm      LV E/e' lateral: 10.7 LV IVS:        1.00 cm      LV e' medial:    6.09 cm/s LVOT diam:     2.20 cm      LV E/e' medial:  7.2 LV SV:         48 LV SV Index:   24 LVOT Area:     3.80 cm  LV Volumes (MOD) LV vol d, MOD A2C: 178.0 ml LV vol d, MOD A4C:  181.0 ml LV vol s, MOD A2C: 132.0 ml LV vol s, MOD A4C: 125.0 ml LV SV MOD A2C:     46.0 ml LV SV MOD A4C:     181.0 ml LV SV MOD BP:      48.4 ml RIGHT VENTRICLE RV S prime:     14.50 cm/s TAPSE (M-mode): 2.2 cm LEFT ATRIUM             Index       RIGHT ATRIUM           Index LA diam:        4.00 cm 2.06 cm/m  RA Area:     14.30 cm LA Vol (A2C):   47.4 ml 24.38 ml/m RA Volume:   40.30 ml  20.73 ml/m LA Vol (A4C):   39.0 ml 20.06 ml/m LA Biplane Vol: 43.7 ml 22.47 ml/m  AORTIC VALVE LVOT Vmax:   74.80 cm/s LVOT Vmean:  49.600 cm/s LVOT VTI:    0.125 m  AORTA Ao Root diam: 3.20 cm MITRAL VALVE MV Area (PHT): 5.54 cm    SHUNTS MV Decel Time: 137 msec    Systemic VTI:  0.12 m MV E velocity: 44.10 cm/s  Systemic Diam: 2.20 cm MV A velocity: 44.10 cm/s MV E/A ratio:  1.00 Dani Gobble Lundon Rosier MD Electronically signed by Sanda Klein MD Signature Date/Time: 11/16/2019/3:46:35 PM    Final     Cardiac Studies  Echo 11/16/2019   1. There is global left ventricular hypokinesis with disproportionately  severe hypokinesis/akinesis of the mid-apical anterior, anteroseptal walls  and apex. This suggests infarction in the LAD artery territory. Cannot  exclude left ventricular apical  thrombus (patient declined Definity contrast). Left ventricular ejection  fraction, by estimation, is 25 to 30%. The left ventricle has severely  decreased  function. The left ventricle demonstrates regional wall motion  abnormalities (see scoring  diagram/findings for description). Indeterminate diastolic filling due to  E-A fusion.  2. Right ventricular systolic function is normal. The right ventricular  size is normal. Tricuspid regurgitation signal is inadequate for assessing  PA pressure.  3. Left atrial size was mildly dilated.  4. The mitral valve is normal in structure. No evidence of mitral valve  regurgitation. No evidence of mitral stenosis.  5. The aortic valve is normal in structure. Aortic valve regurgitation  is  not visualized. No aortic stenosis is present.  6. The inferior vena cava is dilated in size with <50% respiratory  variability, suggesting right atrial pressure of 15 mmHg.   Patient Profile     49 y.o. 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 Newly recognized DM (A1c 6.6%) and elevated creatinine (1.42, uncertain chronicity)  Assessment & Plan    CHF now appears volume-compensated. Able to lie flat. Discrepancy between absence of Q waves or ST changes on ECG, minor increase in troponin and extensive wall motion abnormality on echo. Differential is CAD with remote anterior MI and recurrent angina/new coronary syndrome versus takotsubo cardiomyopathy (due to CO exposure, cocaine use). Plan cardiac cath and coronary angio today, possible PCI, to distinguish ischemic CMP from stress CMP. This procedure has been fully reviewed with the patient and written informed consent has been obtained. Hold RAAS inh until after angio. No beta blockers with acute HF and recent cocaine use. Recheck renal function. Needs counseling re: DM, diet/exercise. Recommend complete abstinence from cocaine.      For questions or updates, please contact [facility] Please consult [email or web address] for contact info under        Signed, Thurmon Fair, MD  11/17/2019, 8:03 AM

## 2019-11-17 NOTE — Progress Notes (Signed)
Pr received from cath lab. CHG bath given. Telebox 21 applied/ccmd notified. Pt c/a/ox4. Pt denies complaints. Vitals stable. TR band in place. Call bell within reach and pt oriented to room. Will continue to monitor.  Lacy Duverney, RN

## 2019-11-17 NOTE — Progress Notes (Signed)
RT reported off to cath lab that patient needed an ABG and let them know ED RN asked that ABG be collected during cath procedure.

## 2019-11-17 NOTE — Interval H&P Note (Signed)
Cath Lab Visit (complete for each Cath Lab visit)  Clinical Evaluation Leading to the Procedure:   ACS: Yes.    Non-ACS:    Anginal Classification: CCS II  Anti-ischemic medical therapy: No Therapy  Non-Invasive Test Results: No non-invasive testing performed  Prior CABG: No previous CABG      History and Physical Interval Note:  11/17/2019 4:42 PM  Austin Mcgee  has presented today for surgery, with the diagnosis of NSTEMI.  The various methods of treatment have been discussed with the patient and family. After consideration of risks, benefits and other options for treatment, the patient has consented to  Procedure(s): LEFT HEART CATH AND CORONARY ANGIOGRAPHY (N/A) as a surgical intervention.  The patient's history has been reviewed, patient examined, no change in status, stable for surgery.  I have reviewed the patient's chart and labs.  Questions were answered to the patient's satisfaction.     Nanetta Batty

## 2019-11-17 NOTE — Progress Notes (Signed)
3 cc of air removed; r neurovascular status intact, report to [employer]

## 2019-11-17 NOTE — Progress Notes (Signed)
3 cc of air removed from TR band, R hand neurovascular status intact. Pt. Reminded to keep r hand and r wrist still and elevated at the level of the heart. Pt. Verbalizes understanding. Awaiting nurse to call back to receive report.

## 2019-11-17 NOTE — Progress Notes (Signed)
ANTICOAGULATION CONSULT NOTE  Pharmacy Consult for heparin Indication: chest pain/ACS  No Known Allergies  Patient Measurements: Height: 5' 7.5" (171.5 cm) Weight: 81.6 kg (180 lb) IBW/kg (Calculated) : 67.25  Vital Signs: BP: 119/78 (04/26 0100) Pulse Rate: 90 (04/26 0100)  Labs: Recent Labs    11/16/19 0913 11/16/19 1140 11/16/19 1527 11/17/19 0040  HGB 13.0  --  13.3  --   HCT 39.7  --  39.0  --   PLT 346  --   --   --   HEPARINUNFRC  --   --   --  <0.10*  CREATININE 1.42*  --   --   --   TROPONINIHS 400* 404*  --   --     Estimated Creatinine Clearance: 65 mL/min (A) (by C-G formula based on SCr of 1.42 mg/dL (H)).  Assessment: 49 y.o. male with chest pain for heparin  Goal of Therapy:  Heparin level 0.3-0.7 units/ml Monitor platelets by anticoagulation protocol: Yes   Plan:  Heparin 2000 units IV bolus, then increase heparin 1300 units/hr Check heparin level in 6 hours.   Geannie Risen, PharmD, BCPS

## 2019-11-17 NOTE — Progress Notes (Signed)
PROGRESS NOTE    Austin Mcgee  YBO:[id number] DOB: 1971/06/10 DOA: 11/16/2019 PCP: Patient, No Pcp Per   Chief Complaint  Patient presents with  . Shortness of Breath  . Chest Pain    Brief Narrative:  Austin Mcgee is Austin Mcgee 49 y.o. male with medical history significant of HTN (not on meds) and tobacco dependence presenting with CP/SOB. He reports that he was having [name] chest pain and couldn't breathe.  It started after he was doing some house painting for his friend last night and then went to bed and the pain started while he was sleeping.  Substernal CP, better with medication given in the ER.  He denies feeling SOB but is hyperventilating.  He thinks it was the fumes from the generator that was running in the house that caused his symptoms.  He felt ok yesterday, but when he laid down to go to sleep it started bothering him.  He feels better now.  He does acknowledge cocaine use [name] couple of days ago.  He has [name] known h/o HTN but has not seen [name] doctor or taken medications for this in several years.  I spoke with his wife.  He lives on the street the majority of the time.  He "does Kripa Foskey lot of drugs, it's not just cocaine."  They don't live together because of his poor life choices.  He is an "avid drug user of different sorts."  [name] few years ago, he was unresponsive and had 5 different drugs in his system.  She reports 1 beer [name] day.  He has been to [facility] several times recently and been told he is having "ministrokes."  She has been going through this with him for 10 years now and he has been in treatment numerous times.  Assessment & Plan:   Principal Problem:   NSTEMI (non-ST elevated myocardial infarction) (HCC) Active Problems:   Accelerated hypertension   Cocaine abuse (HCC)   Alcohol dependence (HCC)   Tobacco dependence   Tachypnea  NSTEMI -Patient with substernal chest pain that came on acutely at rest following exertion, also with recent cocaine use -CXR concerning for volume  overload - Elevated troponin, flat  - echo with global LV hypokinesis with disproportionately severe hypokinesis/akinesis of the mid-apical anterior, anteroseptal walls and apex.  This suggests infarction in the LAD territory.  EF 25-30%.  Can't exclude LV apical thrombus.  See report. IVC dilated with <50% resp variability. - cardiology c/s, pending catheterization 4/26 - heparin, aspirin, Lipitor - no beta blocker with acute HF and recent cocaine use -Risk factor stratification with HgbA1c (6.6) and LDL 110, HDL 31; will also check TSH (wnl) and UDS (cocaine positive)  HFrEF:  CXR with concern for volume overload Cardiology c/s, plan for cath today Echo as above (see report)  HTN -Not taking home meds -Avoid beta blockers for now due to cocaine -Will add prn hydralazine  Elevated Creatinine: creatinine stable to 1.4.  ?CKD II? UA without protein or RBC's Continue to monitor  Newly Diagnosed T2DM: needs diabetic education.  SSI  Cocaine abuse -Possibly cocaine-induced vasospasm leading to current situation -Cessation encouraged and should be encouraged on an ongoing basis  ETOH dependence -Patient reports daily drinking -CIWA protocol -TOC team consult for possible inpatient treatment  Tobacco dependence -Encourage cessation.   -This was discussed with the patient and should be reviewed on an ongoing basis.   -Patch ordered at patient request.  Tachypnea, possibly mild carbon monoxide toxicity -Patient with persistent tachypnea  since arrival -He reports concern for "fumes" from the generator that stopped working overnight -elevated carboxyhemoglobin at presentation -follow repeat blood gas/carboxyhemoglobin today  DVT prophylaxis: heparin gtt Code Status: full  Family Communication: none  At bedside Disposition:   Status is: Inpatient  Remains inpatient appropriate because:Inpatient level of care appropriate due to severity of illness   Dispo: The patient is  from: Home              Anticipated d/c is to: Home              Anticipated d/c date is: 2 days              Patient currently is not medically stable to d/c.  Consultants:   cardiology  Procedures:  Echo IMPRESSIONS    1. There is global left ventricular hypokinesis with disproportionately  severe hypokinesis/akinesis of the mid-apical anterior, anteroseptal walls  and apex. This suggests infarction in the LAD artery territory. Cannot  exclude left ventricular apical  thrombus (patient declined Definity contrast). Left ventricular ejection  fraction, by estimation, is 25 to 30%. The left ventricle has severely  decreased function. The left ventricle demonstrates regional wall motion  abnormalities (see scoring  diagram/findings for description). Indeterminate diastolic filling due to  E-Taneshia Lorence fusion.  2. Right ventricular systolic function is normal. The right ventricular  size is normal. Tricuspid regurgitation signal is inadequate for assessing  PA pressure.  3. Left atrial size was mildly dilated.  4. The mitral valve is normal in structure. No evidence of mitral valve  regurgitation. No evidence of mitral stenosis.  5. The aortic valve is normal in structure. Aortic valve regurgitation is  not visualized. No aortic stenosis is present.  6. The inferior vena cava is dilated in size with <50% respiratory  variability, suggesting right atrial pressure of 15 mmHg.   Antimicrobials:  Anti-infectives (From admission, onward)   None     Subjective: Sleeping Asking questions about procedure No c/o pain at the present  Objective: Vitals:   11/17/19 0341 11/17/19 0500 11/17/19 0629 11/17/19 0630  BP: 105/72 126/89 (!) 131/92   Pulse: 80 86 91 78  Resp: 16 13 15    Temp:      TempSrc:      SpO2: 99% 98% 100% 100%  Weight:      Height:        Intake/Output Summary (Last 24 hours) at 11/17/2019 1549 Last data filed at 11/17/2019 0035 Gross per 24 hour  Intake  120.78 ml  Output 300 ml  Net -179.22 ml   Filed Weights   11/16/19 0905  Weight: 81.6 kg    Examination:  General exam: Appears calm and comfortable  Respiratory system: Clear to auscultation. Respiratory effort normal. Cardiovascular system: S1 & S2 heard, RRR.  Gastrointestinal system: Abdomen is nondistended, soft and nontender.  Central nervous system: Alert and oriented. No focal neurological deficits. Extremities: no LEE Skin: No rashes, lesions or ulcers Psychiatry: Judgement and insight appear normal. Mood & affect appropriate.     Data Reviewed: I have personally reviewed following labs and imaging studies  CBC: Recent Labs  Lab 11/16/19 0913 11/16/19 1527 11/17/19 0929  WBC 11.9*  --  7.0  HGB 13.0 13.3 13.0  HCT 39.7 39.0 39.6  MCV 88.8  --  89.6  PLT 346  --  318    Basic Metabolic Panel: Recent Labs  Lab 11/16/19 0913 11/16/19 1527 11/17/19 0929  NA 134* 136 136  K  5.0 3.8 3.7  CL 99  --  101  CO2 21*  --  24  GLUCOSE 123*  --  195*  BUN 33*  --  26*  CREATININE 1.42*  --  1.42*  CALCIUM 10.0  --  9.3    GFR: Estimated Creatinine Clearance: 65 mL/min (Conor Filsaime) (by C-G formula based on SCr of 1.42 mg/dL (H)).  Liver Function Tests: No results for input(s): AST, ALT, ALKPHOS, BILITOT, PROT, ALBUMIN in the last 168 hours.  CBG: No results for input(s): GLUCAP in the last 168 hours.   Recent Results (from the past 240 hour(s))  Respiratory Panel by RT PCR (Flu Patrizia Paule&B, Covid) - Nasopharyngeal Swab     Status: None   Collection Time: 11/16/19 11:42 AM   Specimen: Nasopharyngeal Swab  Result Value Ref Range Status   SARS Coronavirus 2 by RT PCR NEGATIVE NEGATIVE Final    Comment: (NOTE) SARS-CoV-2 target nucleic acids are NOT DETECTED. The SARS-CoV-2 RNA is generally detectable in upper respiratoy specimens during the acute phase of infection. The lowest concentration of SARS-CoV-2 viral copies this assay can detect is 131 copies/mL. Hayla Hinger negative  result does not preclude SARS-Cov-2 infection and should not be used as the sole basis for treatment or other patient management decisions. Torri Michalski negative result may occur with  improper specimen collection/handling, submission of specimen other than nasopharyngeal swab, presence of viral mutation(s) within the areas targeted by this assay, and inadequate number of viral copies (<131 copies/mL). Shanna Un negative result must be combined with clinical observations, patient history, and epidemiological information. The expected result is Negative. Fact Sheet for Patients:  PinkCheek.be Fact Sheet for Healthcare Providers:  GravelBags.it This test is not yet ap proved or cleared by the [place] FDA and  has been authorized for detection and/or diagnosis of SARS-CoV-2 by FDA under an Emergency Use Authorization (EUA). This EUA will remain  in effect (meaning this test can be used) for the duration of the COVID-19 declaration under Section 564(b)(1) of the Act, 21 U.S.C. section 360bbb-3(b)(1), unless the authorization is terminated or revoked sooner.    Influenza Omarri Eich by PCR NEGATIVE NEGATIVE Final   Influenza B by PCR NEGATIVE NEGATIVE Final    Comment: (NOTE) The Xpert Xpress SARS-CoV-2/FLU/RSV assay is intended as an aid in  the diagnosis of influenza from Nasopharyngeal swab specimens and  should not be used as Marg Macmaster sole basis for treatment. Nasal washings and  aspirates are unacceptable for Xpert Xpress SARS-CoV-2/FLU/RSV  testing. Fact Sheet for Patients: PinkCheek.be Fact Sheet for Healthcare Providers: GravelBags.it This test is not yet approved or cleared by the [place] FDA and  has been authorized for detection and/or diagnosis of SARS-CoV-2 by  FDA under an Emergency Use Authorization (EUA). This EUA will remain  in effect (meaning this test can be used) for the  duration of the  Covid-19 declaration under Section 564(b)(1) of the Act, 21  U.S.C. section 360bbb-3(b)(1), unless the authorization is  terminated or revoked. Performed at [facility], [place] [address] [place], [place] [address]          Radiology Studies: DG Chest 2 View  Result Date: 11/16/2019 CLINICAL DATA:  Chest pain, shortness of breath for 1 hour. EXAM: CHEST - 2 VIEW COMPARISON:  01/12/2019 FINDINGS: Cardiomediastinal contours are enlarged. Central pulmonary vascular engorgement. Increased interstitial markings bilaterally. Subtle basilar opacities bilaterally. No signs of pleural effusion. Visualized skeletal structures are unremarkable. IMPRESSION: Findings suspicious for heart failure or volume overload. Patchy  basilar opacities may represent atelectasis or developing infection. Electronically Signed   By: Donzetta Kohut M.D.   On: 11/16/2019 10:11   ECHOCARDIOGRAM COMPLETE  Result Date: 11/16/2019    ECHOCARDIOGRAM REPORT   Patient Name:   TRAYDEN BRANDY Date of Exam: 11/16/2019 Medical Rec #:  [id number]    Height:       67.5 in Accession #:    [id number]   Weight:       180.0 lb Date of Birth:  18-Oct-1970     BSA:          1.944 m Patient Age:    49 years     BP:           124/95 mmHg Patient Gender: M            HR:           100 bpm. Exam Location:  Inpatient Procedure: 2D Echo, Cardiac Doppler and Color Doppler STAT ECHO Indications:    NSTEMI I21.4  History:        Patient has no prior history of Echocardiogram examinations.                 Arrythmias:non-specific ST changes; Risk Factors:Current Smoker.  Sonographer:    Renella Cunas RDCS Referring Phys: [id number] JENNIFER YATES  Sonographer Comments: Refused definity. IMPRESSIONS  1. There is global left ventricular hypokinesis with disproportionately severe hypokinesis/akinesis of the mid-apical anterior, anteroseptal walls and apex. This suggests infarction in the LAD artery territory. Cannot exclude left ventricular apical  thrombus (patient declined Definity contrast). Left ventricular ejection fraction, by estimation, is 25 to 30%. The left ventricle has severely decreased function. The left ventricle demonstrates regional wall motion abnormalities (see scoring diagram/findings for description). Indeterminate diastolic filling due to E-Nagi Furio fusion.  2. Right ventricular systolic function is normal. The right ventricular size is normal. Tricuspid regurgitation signal is inadequate for assessing PA pressure.  3. Left atrial size was mildly dilated.  4. The mitral valve is normal in structure. No evidence of mitral valve regurgitation. No evidence of mitral stenosis.  5. The aortic valve is normal in structure. Aortic valve regurgitation is not visualized. No aortic stenosis is present.  6. The inferior vena cava is dilated in size with <50% respiratory variability, suggesting right atrial pressure of 15 mmHg. FINDINGS  Left Ventricle: There is global left ventricular hypokinesis with disproportionately severe hypokinesis/akinesis of the mid-apical anterior, anteroseptal walls and apex. This suggests infarction in the LAD artery territory. Cannot exclude left ventricular apical thrombus (patient declined Definity contrast). Left ventricular ejection fraction, by estimation, is 25 to 30%. The left ventricle has severely decreased function. The left ventricle demonstrates regional wall motion abnormalities. The  left ventricular internal cavity size was normal in size. There is no left ventricular hypertrophy. Indeterminate diastolic filling due to E-Barbarita Hutmacher fusion.  LV Wall Scoring: The mid and distal anterior wall, mid and distal anterior septum, apical inferior segment, and apex are akinetic. The entire lateral wall, inferior wall, basal anteroseptal segment, mid inferoseptal segment, basal anterior segment, and basal inferoseptal segment are hypokinetic. Right Ventricle: The right ventricular size is normal. No increase in right ventricular  wall thickness. Right ventricular systolic function is normal. Tricuspid regurgitation signal is inadequate for assessing PA pressure. Left Atrium: Left atrial size was mildly dilated. Right Atrium: Right atrial size was normal in size. Pericardium: There is no evidence of pericardial effusion. Mitral Valve: The mitral valve is normal in structure. Normal mobility of the  mitral valve leaflets. No evidence of mitral valve regurgitation. No evidence of mitral valve stenosis. Tricuspid Valve: The tricuspid valve is normal in structure. Tricuspid valve regurgitation is trivial. No evidence of tricuspid stenosis. Aortic Valve: The aortic valve is normal in structure. Aortic valve regurgitation is not visualized. No aortic stenosis is present. Pulmonic Valve: The pulmonic valve was normal in structure. Pulmonic valve regurgitation is not visualized. No evidence of pulmonic stenosis. Aorta: The aortic root is normal in size and structure. Venous: The inferior vena cava is dilated in size with less than 50% respiratory variability, suggesting right atrial pressure of 15 mmHg. IAS/Shunts: No atrial level shunt detected by color flow Doppler.  LEFT VENTRICLE PLAX 2D LVIDd:         5.20 cm      Diastology LVIDs:         4.50 cm      LV e' lateral:   4.13 cm/s LV PW:         1.00 cm      LV E/e' lateral: 10.7 LV IVS:        1.00 cm      LV e' medial:    6.09 cm/s LVOT diam:     2.20 cm      LV E/e' medial:  7.2 LV SV:         48 LV SV Index:   24 LVOT Area:     3.80 cm  LV Volumes (MOD) LV vol d, MOD A2C: 178.0 ml LV vol d, MOD A4C: 181.0 ml LV vol s, MOD A2C: 132.0 ml LV vol s, MOD A4C: 125.0 ml LV SV MOD A2C:     46.0 ml LV SV MOD A4C:     181.0 ml LV SV MOD BP:      48.4 ml RIGHT VENTRICLE RV S prime:     14.50 cm/s TAPSE (M-mode): 2.2 cm LEFT ATRIUM             Index       RIGHT ATRIUM           Index LA diam:        4.00 cm 2.06 cm/m  RA Area:     14.30 cm LA Vol (A2C):   47.4 ml 24.38 ml/m RA Volume:   40.30 ml  20.73  ml/m LA Vol (A4C):   39.0 ml 20.06 ml/m LA Biplane Vol: 43.7 ml 22.47 ml/m  AORTIC VALVE LVOT Vmax:   74.80 cm/s LVOT Vmean:  49.600 cm/s LVOT VTI:    0.125 m  AORTA Ao Root diam: 3.20 cm MITRAL VALVE MV Area (PHT): 5.54 cm    SHUNTS MV Decel Time: 137 msec    Systemic VTI:  0.12 m MV E velocity: 44.10 cm/s  Systemic Diam: 2.20 cm MV Lakaya Tolen velocity: 44.10 cm/s MV E/Tamana Hatfield ratio:  1.00 Mihai Croitoru MD Electronically signed by Thurmon Fair MD Signature Date/Time: 11/16/2019/3:46:35 PM    Final         Scheduled Meds: . aspirin EC  81 mg Oral Daily  . atorvastatin  40 mg Oral q1800  . docusate sodium  100 mg Oral BID  . folic acid  1 mg Oral Daily  . heparin  2,000 Units Intravenous Once  . multivitamin with minerals  1 tablet Oral Daily  . nicotine  14 mg Transdermal Daily  . sodium chloride flush  3 mL Intravenous Q12H  . sodium chloride flush  3 mL Intravenous Q12H  . thiamine  100 mg Oral Daily   Or  . thiamine  100 mg Intravenous Daily   Continuous Infusions: . sodium chloride    . sodium chloride    . sodium chloride    . heparin 1,450 Units/hr (11/17/19 1548)     LOS: 1 day    Time spent: over 30 min    Lacretia Nicks, MD [facility]   To contact the attending provider between 7A-7P or the covering provider during after hours 7P-7A, please log into the web site [email or web address] and access using universal [place] password for that web site. If you do not have the password, please call the hospital operator.  11/17/2019, 3:49 PM

## 2019-11-17 NOTE — Progress Notes (Addendum)
Per RN she states that ABG should be obtained in the cath lab so patient only gets one stick.

## 2019-11-17 NOTE — Progress Notes (Signed)
Progress Note  Patient Name: Austin Mcgee Date of Encounter: 11/17/2019  Primary Cardiologist: No primary care provider on file. New ([place])  Subjective   Feels much better, dyspnea and chest pain resolved.  Inpatient Medications    Scheduled Meds: . aspirin EC  81 mg Oral Daily  . atorvastatin  40 mg Oral q1800  . docusate sodium  100 mg Oral BID  . folic acid  1 mg Oral Daily  . multivitamin with minerals  1 tablet Oral Daily  . nicotine  14 mg Transdermal Daily  . sodium chloride flush  3 mL Intravenous Q12H  . thiamine  100 mg Oral Daily   Or  . thiamine  100 mg Intravenous Daily   Continuous Infusions: . sodium chloride    . heparin 1,300 Units/hr (11/17/19 0133)   PRN Meds: sodium chloride, acetaminophen **OR** acetaminophen, bisacodyl, hydrALAZINE, HYDROcodone-acetaminophen, LORazepam **OR** LORazepam, morphine injection, nitroGLYCERIN, ondansetron **OR** ondansetron (ZOFRAN) IV, polyethylene glycol, sodium chloride flush   Vital Signs    Vitals:   11/17/19 0341 11/17/19 0500 11/17/19 0629 11/17/19 0630  BP: 105/72 126/89 (!) 131/92   Pulse: 80 86 91 78  Resp: 16 13 15    Temp:      TempSrc:      SpO2: 99% 98% 100% 100%  Weight:      Height:        Intake/Output Summary (Last 24 hours) at 11/17/2019 0803 Last data filed at 11/17/2019 0035 Gross per 24 hour  Intake 120.78 ml  Output 300 ml  Net -179.22 ml   Last 3 Weights 11/16/2019 10/02/2011  Weight (lbs) 180 lb 200 lb  Weight (kg) 81.647 kg 90.719 kg      Telemetry    n/a - Personally Reviewed  ECG    NSR, voltage for LVH, no repol changes - Personally Reviewed  Physical Exam  Appears fit GEN: No acute distress.   Neck: No JVD Cardiac: RRR, no murmurs, rubs, or gallops.  Respiratory: Clear to auscultation bilaterally. GI: Soft, nontender, non-distended  MS: No edema; No deformity. Neuro:  Nonfocal  Psych: Normal affect   Labs    High Sensitivity Troponin:   Recent Labs  Lab  11/16/19 0913 11/16/19 1140  TROPONINIHS 400* 404*      Chemistry Recent Labs  Lab 11/16/19 0913 11/16/19 1527  NA 134* 136  K 5.0 3.8  CL 99  --   CO2 21*  --   GLUCOSE 123*  --   BUN 33*  --   CREATININE 1.42*  --   CALCIUM 10.0  --   GFRNONAA 58*  --   GFRAA >60  --   ANIONGAP 14  --      Hematology Recent Labs  Lab 11/16/19 0913 11/16/19 1527  WBC 11.9*  --   RBC 4.47  --   HGB 13.0 13.3  HCT 39.7 39.0  MCV 88.8  --   MCH 29.1  --   MCHC 32.7  --   RDW 15.1  --   PLT 346  --     BNP Recent Labs  Lab 11/16/19 1142  BNP 566.4*     DDimer No results for input(s): DDIMER in the last 168 hours.   Radiology    DG Chest 2 View  Result Date: 11/16/2019 CLINICAL DATA:  Chest pain, shortness of breath for 1 hour. EXAM: CHEST - 2 VIEW COMPARISON:  01/12/2019 FINDINGS: Cardiomediastinal contours are enlarged. Central pulmonary vascular engorgement. Increased interstitial markings bilaterally. Subtle  basilar opacities bilaterally. No signs of pleural effusion. Visualized skeletal structures are unremarkable. IMPRESSION: Findings suspicious for heart failure or volume overload. Patchy basilar opacities may represent atelectasis or developing infection. Electronically Signed   By: Donzetta Kohut M.D.   On: 11/16/2019 10:11   ECHOCARDIOGRAM COMPLETE  Result Date: 11/16/2019    ECHOCARDIOGRAM REPORT   Patient Name:   Austin Mcgee Date of Exam: 11/16/2019 Medical Rec #:  [id number]    Height:       67.5 in Accession #:    [id number]   Weight:       180.0 lb Date of Birth:  [birth year]     BSA:          1.944 m Patient Age:    49 years     BP:           124/95 mmHg Patient Gender: M            HR:           100 bpm. Exam Location:  Inpatient Procedure: 2D Echo, Cardiac Doppler and Color Doppler STAT ECHO Indications:    NSTEMI I21.4  History:        Patient has no prior history of Echocardiogram examinations.                 Arrythmias:non-specific ST changes; Risk  Factors:Current Smoker.  Sonographer:    Renella Cunas RDCS Referring Phys: [id number] JENNIFER YATES  Sonographer Comments: Refused definity. IMPRESSIONS  1. There is global left ventricular hypokinesis with disproportionately severe hypokinesis/akinesis of the mid-apical anterior, anteroseptal walls and apex. This suggests infarction in the LAD artery territory. Cannot exclude left ventricular apical thrombus (patient declined Definity contrast). Left ventricular ejection fraction, by estimation, is 25 to 30%. The left ventricle has severely decreased function. The left ventricle demonstrates regional wall motion abnormalities (see scoring diagram/findings for description). Indeterminate diastolic filling due to E-A fusion.  2. Right ventricular systolic function is normal. The right ventricular size is normal. Tricuspid regurgitation signal is inadequate for assessing PA pressure.  3. Left atrial size was mildly dilated.  4. The mitral valve is normal in structure. No evidence of mitral valve regurgitation. No evidence of mitral stenosis.  5. The aortic valve is normal in structure. Aortic valve regurgitation is not visualized. No aortic stenosis is present.  6. The inferior vena cava is dilated in size with <50% respiratory variability, suggesting right atrial pressure of 15 mmHg. FINDINGS  Left Ventricle: There is global left ventricular hypokinesis with disproportionately severe hypokinesis/akinesis of the mid-apical anterior, anteroseptal walls and apex. This suggests infarction in the LAD artery territory. Cannot exclude left ventricular apical thrombus (patient declined Definity contrast). Left ventricular ejection fraction, by estimation, is 25 to 30%. The left ventricle has severely decreased function. The left ventricle demonstrates regional wall motion abnormalities. The  left ventricular internal cavity size was normal in size. There is no left ventricular hypertrophy. Indeterminate diastolic filling due to  E-A fusion.  LV Wall Scoring: The mid and distal anterior wall, mid and distal anterior septum, apical inferior segment, and apex are akinetic. The entire lateral wall, inferior wall, basal anteroseptal segment, mid inferoseptal segment, basal anterior segment, and basal inferoseptal segment are hypokinetic. Right Ventricle: The right ventricular size is normal. No increase in right ventricular wall thickness. Right ventricular systolic function is normal. Tricuspid regurgitation signal is inadequate for assessing PA pressure. Left Atrium: Left atrial size was mildly dilated. Right Atrium: Right atrial size was normal  in size. Pericardium: There is no evidence of pericardial effusion. Mitral Valve: The mitral valve is normal in structure. Normal mobility of the mitral valve leaflets. No evidence of mitral valve regurgitation. No evidence of mitral valve stenosis. Tricuspid Valve: The tricuspid valve is normal in structure. Tricuspid valve regurgitation is trivial. No evidence of tricuspid stenosis. Aortic Valve: The aortic valve is normal in structure. Aortic valve regurgitation is not visualized. No aortic stenosis is present. Pulmonic Valve: The pulmonic valve was normal in structure. Pulmonic valve regurgitation is not visualized. No evidence of pulmonic stenosis. Aorta: The aortic root is normal in size and structure. Venous: The inferior vena cava is dilated in size with less than 50% respiratory variability, suggesting right atrial pressure of 15 mmHg. IAS/Shunts: No atrial level shunt detected by color flow Doppler.  LEFT VENTRICLE PLAX 2D LVIDd:         5.20 cm      Diastology LVIDs:         4.50 cm      LV e' lateral:   4.13 cm/s LV PW:         1.00 cm      LV E/e' lateral: 10.7 LV IVS:        1.00 cm      LV e' medial:    6.09 cm/s LVOT diam:     2.20 cm      LV E/e' medial:  7.2 LV SV:         48 LV SV Index:   24 LVOT Area:     3.80 cm  LV Volumes (MOD) LV vol d, MOD A2C: 178.0 ml LV vol d, MOD A4C:  181.0 ml LV vol s, MOD A2C: 132.0 ml LV vol s, MOD A4C: 125.0 ml LV SV MOD A2C:     46.0 ml LV SV MOD A4C:     181.0 ml LV SV MOD BP:      48.4 ml RIGHT VENTRICLE RV S prime:     14.50 cm/s TAPSE (M-mode): 2.2 cm LEFT ATRIUM             Index       RIGHT ATRIUM           Index LA diam:        4.00 cm 2.06 cm/m  RA Area:     14.30 cm LA Vol (A2C):   47.4 ml 24.38 ml/m RA Volume:   40.30 ml  20.73 ml/m LA Vol (A4C):   39.0 ml 20.06 ml/m LA Biplane Vol: 43.7 ml 22.47 ml/m  AORTIC VALVE LVOT Vmax:   74.80 cm/s LVOT Vmean:  49.600 cm/s LVOT VTI:    0.125 m  AORTA Ao Root diam: 3.20 cm MITRAL VALVE MV Area (PHT): 5.54 cm    SHUNTS MV Decel Time: 137 msec    Systemic VTI:  0.12 m MV E velocity: 44.10 cm/s  Systemic Diam: 2.20 cm MV A velocity: 44.10 cm/s MV E/A ratio:  1.00 Dani Gobble Worthy Boschert MD Electronically signed by Sanda Klein MD Signature Date/Time: 11/16/2019/3:46:35 PM    Final     Cardiac Studies  Echo 11/16/2019   1. There is global left ventricular hypokinesis with disproportionately  severe hypokinesis/akinesis of the mid-apical anterior, anteroseptal walls  and apex. This suggests infarction in the LAD artery territory. Cannot  exclude left ventricular apical  thrombus (patient declined Definity contrast). Left ventricular ejection  fraction, by estimation, is 25 to 30%. The left ventricle has severely  decreased  function. The left ventricle demonstrates regional wall motion  abnormalities (see scoring  diagram/findings for description). Indeterminate diastolic filling due to  E-A fusion.  2. Right ventricular systolic function is normal. The right ventricular  size is normal. Tricuspid regurgitation signal is inadequate for assessing  PA pressure.  3. Left atrial size was mildly dilated.  4. The mitral valve is normal in structure. No evidence of mitral valve  regurgitation. No evidence of mitral stenosis.  5. The aortic valve is normal in structure. Aortic valve regurgitation  is  not visualized. No aortic stenosis is present.  6. The inferior vena cava is dilated in size with <50% respiratory  variability, suggesting right atrial pressure of 15 mmHg.   Patient Profile     49 y.o. 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 Newly recognized DM (A1c 6.6%) and elevated creatinine (1.42, uncertain chronicity)  Assessment & Plan    CHF now appears volume-compensated. Able to lie flat. Discrepancy between absence of Q waves or ST changes on ECG, minor increase in troponin and extensive wall motion abnormality on echo. Differential is CAD with remote anterior MI and recurrent angina/new coronary syndrome versus takotsubo cardiomyopathy (due to CO exposure, cocaine use). Plan cardiac cath and coronary angio today, possible PCI, to distinguish ischemic CMP from stress CMP. This procedure has been fully reviewed with the patient and written informed consent has been obtained. Hold RAAS inh until after angio. No beta blockers with acute HF and recent cocaine use. Recheck renal function. Needs counseling re: DM, diet/exercise. Recommend complete abstinence from cocaine.      For questions or updates, please contact [facility] Please consult [email or web address] for contact info under        Signed, Thurmon Fair, MD  11/17/2019, 8:03 AM

## 2019-11-18 ENCOUNTER — Inpatient Hospital Stay (HOSPITAL_COMMUNITY): Payer: Self-pay

## 2019-11-18 LAB — COMPREHENSIVE METABOLIC PANEL
ALT: 72 U/L — ABNORMAL HIGH (ref 0–44)
AST: 64 U/L — ABNORMAL HIGH (ref 15–41)
Albumin: 3 g/dL — ABNORMAL LOW (ref 3.5–5.0)
Alkaline Phosphatase: 59 U/L (ref 38–126)
Anion gap: 9 (ref 5–15)
BUN: 16 mg/dL (ref 6–20)
CO2: 21 mmol/L — ABNORMAL LOW (ref 22–32)
Calcium: 9 mg/dL (ref 8.9–10.3)
Chloride: 103 mmol/L (ref 98–111)
Creatinine, Ser: 1.33 mg/dL — ABNORMAL HIGH (ref 0.61–1.24)
GFR calc Af Amer: >60 mL/min (ref >60)
GFR calc non Af Amer: >60 mL/min (ref >60)
Glucose, Bld: 172 mg/dL — ABNORMAL HIGH (ref 70–99)
Potassium: 3.2 mmol/L — ABNORMAL LOW (ref 3.5–5.1)
Sodium: 133 mmol/L — ABNORMAL LOW (ref 135–145)
Total Bilirubin: 0.9 mg/dL (ref 0.3–1.2)
Total Protein: 6.7 g/dL (ref 6.5–8.1)

## 2019-11-18 LAB — MAGNESIUM: Magnesium: 1.8 mg/dL (ref 1.7–2.4)

## 2019-11-18 LAB — CBC
HCT: 38.9 % — ABNORMAL LOW (ref 39.0–52.0)
Hemoglobin: 12.4 g/dL — ABNORMAL LOW (ref 13.0–17.0)
MCH: 28.6 pg (ref 26.0–34.0)
MCHC: 31.9 g/dL (ref 30.0–36.0)
MCV: 89.8 fL (ref 80.0–100.0)
Platelets: 276 10*3/uL (ref 150–400)
RBC: 4.33 MIL/uL (ref 4.22–5.81)
RDW: 15 % (ref 11.5–15.5)
WBC: 6.3 10*3/uL (ref 4.0–10.5)
nRBC: 0 % (ref 0.0–0.2)

## 2019-11-18 LAB — GLUCOSE, CAPILLARY: Glucose-Capillary: 186 mg/dL — ABNORMAL HIGH (ref 70–99)

## 2019-11-18 LAB — PHOSPHORUS: Phosphorus: 2.8 mg/dL (ref 2.5–4.6)

## 2019-11-18 MED ORDER — METFORMIN HCL 500 MG PO TABS: 500.0000 mg | ORAL_TABLET | Freq: Two times a day (BID) | ORAL | 0 refills | Status: AC

## 2019-11-18 MED ORDER — LOSARTAN POTASSIUM 25 MG PO TABS
25.0000 mg | ORAL_TABLET | Freq: Every day | ORAL | 0 refills | Status: AC
Start: 2019-11-18 — End: 2019-12-18

## 2019-11-18 MED ORDER — ASPIRIN 81 MG PO TBEC: 81.0000 mg | DELAYED_RELEASE_TABLET | Freq: Every day | ORAL | 0 refills | Status: AC

## 2019-11-18 MED ORDER — ATORVASTATIN CALCIUM 40 MG PO TABS: 40.0000 mg | ORAL_TABLET | Freq: Every day | ORAL | 0 refills | Status: AC

## 2019-11-18 MED ORDER — POTASSIUM CHLORIDE CRYS ER 20 MEQ PO TBCR: 40.0000 meq | EXTENDED_RELEASE_TABLET | ORAL | Status: DC

## 2019-11-18 MED ORDER — BLOOD GLUCOSE MONITOR KIT: PACK | 0 refills | Status: AC

## 2019-11-18 NOTE — Discharge Summary (Signed)
Physician Discharge Summary  Austin Mcgee YKZ:[id number] DOB: 1970-09-08 DOA: 11/16/2019  PCP: Patient, No Pcp Per  Admit date: 11/16/2019 Discharge date: 11/18/2019  Time spent: 40 minutes  Recommendations for Outpatient Follow-up:  1. Follow outpatient CBC/CMP 2. Follow with cardiology outpatient 3. New onset diabetes - started on metformin - follow outpatient  4. Follow outpatient CXR 5. Mr. Mcgee was unwilling to wait for appointments to be scheduled and cardiology to come and see him - he was insistent on leaving when his ride got here, whether or not we were ready - discussed with cardiology and prescribed his discharge medications, though per RN notes, it appears he did not wait for any of his discharge paperwork and signed AMA papers - encouraged follow up with cardiology and establishing with PCP - medications were prescribed for him prior to discharge  Discharge Diagnoses:  Principal Problem:   NSTEMI (non-Mcgee elevated myocardial infarction) (Austin) Active Problems:   Accelerated hypertension   Cocaine abuse (Austin Mcgee)   Alcohol dependence (Austin Mcgee)   Tobacco dependence   Tachypnea  Discharge Condition: stable  Diet recommendation: heart healthy  Filed Weights   11/16/19 0905 11/18/19 0514  Weight: 81.6 kg 77.8 kg    History of present illness:  Austin Mcgee 49 y.o.malewith medical history significant ofHTN (not on meds) and tobacco dependence presenting with CP/SOB.He reports that he was having Austin Mcgee chest pain and couldn't breathe. It started afterhe was doing some house painting for his friend last night and then went to bed and the pain started while he was sleeping. Substernal CP, better with medication given in the ER. He denies feeling SOB but is hyperventilating. He thinks it was the fumes from the generator that was running in the house that caused his symptoms. He felt ok yesterday, but when he laid down to go to sleep it started bothering him. He feels better now.  He does acknowledge cocaine use Austin Mcgee couple of days ago.  He has Austin Mcgee known h/o HTN but has not seen Austin Mcgee doctor or taken medications for this in several years.  I spoke with his wife. He lives on the street the majority of the time. He "does Austin Mcgee lot of drugs, it's not just cocaine." They don't live together because of his poor life choices. He is an "avid drug user of different sorts." Austin Mcgee few years ago, he was unresponsive and had 5 different drugs in his system. She reports 1 beer Austin Mcgee day. He has been to Austin Mcgee several times recently and been told he is having "ministrokes." She has been going through this with him for 10 years now and he has been in treatment numerous times.  He was admitted for an NSTEMI and carbon monoxide poisoning.  He had cath which showed normal coronary arteries and filling pressures, thought was for demand ischemia.  His cardiomyopathy was thought to be nonischemic.  He was started on losartan.  He was insistent on leaving when his ride got here and would not wait for [place] to arrange follow up or complete education.  He demonstrated the capacity to leave against medical advice.  Encouraged cessation of drug use and follow up outpatient with cards and PCP.    Hospital Course:  NSTEMI -Patient with substernal chest pain that came on acutely at restfollowing exertion, also with recent cocaine use -CXRconcerning for volume overload - Elevated troponin, flat  - echo with global LV hypokinesis with disproportionately severe hypokinesis/akinesis of the mid-apical anterior, anteroseptal walls and apex.  This  suggests infarction in the LAD territory.  EF 25-30%.  Can't exclude LV apical thrombus.  See report. IVC dilated with <50% resp variability. - s/p cath -> with normal coronary arteries and filling pressures.  Suspected demand ischemia.  Recommended guideline directed medical therapy for his nonischemic cardiomyopathy - prescribed losartan at discharge -Risk factor stratification  with HgbA1c (6.6) and LDL 110, HDL 31; will also check TSH (wnl) and UDS (cocaine positive)  HFrEF:  CXR with concern for volume overload Nonischemic Echo as above (see report) Losartan prescribed at discharge  HTN -Not taking home meds -Avoid beta blockers for now due to cocaine -Start lostartan at discharge  Elevated Creatinine: creatinine stable to 1.4.  ?CKD II? UA without protein or RBC's Continue to monitor  Newly Diagnosed T2DM: needs diabetic education.  Metformin at discharge.  Cocaine abuse -Possibly cocaine-induced vasospasm leading to current situation -Cessation encouraged and should be encouraged on an ongoing basis  ETOH dependence -Patientreports daily drinking -CIWA protocol -TOC teamconsult for possible inpatient treatment  Tobacco dependence -Encourage cessation.  -This was discussed with the patient and should be reviewed on an ongoing basis.  -Patch ordered at patient request.  Tachypnea, possibly mild carbon monoxide toxicity -Patient with persistent tachypnea since arrival -He reports concern for "fumes" from the generator that stopped working overnight -elevated carboxyhemoglobin at presentation -follow repeat blood gas/carboxyhemoglobin today (he refused lab draw)  Procedures: Cath History obtained from chart review.49 y.o.malesmoker with HTN and relatively recent cocaine use, presenting with severe chest pain, acute systolic HF and newly recognized severe cardiomyopathy with regional wall motion abnormalities, also with moderately elevated CO levels following exposure to fumes from Austin Mcgee house generator. Newly recognized DM (A1c 6.6%) and elevated creatinine (5.46, uncertain chronicity).  His troponins rose to the 400 level.  His EF was 35% with regional wall motion abnormality in the LAD distribution.  He presents now for diagnostic coronary angiography.   IMPRESSION: Austin Mcgee has normal coronary arteries and normal filling  pressures.  I think his troponin leak was related to demand ischemia.  He has Austin Mcgee nonischemic cardiomyopathy.  Guideline directed optimal medical therapy will be recommended.  The sheath was removed and Austin Mcgee TR band was placed on the right wrist to achieve patent hemostasis.  The patient left lab in stable condition.  Echo IMPRESSIONS    1. There is global left ventricular hypokinesis with disproportionately  severe hypokinesis/akinesis of the mid-apical anterior, anteroseptal walls  and apex. This suggests infarction in the LAD artery territory. Cannot  exclude left ventricular apical  thrombus (patient declined Definity contrast). Left ventricular ejection  fraction, by estimation, is 25 to 30%. The left ventricle has severely  decreased function. The left ventricle demonstrates regional wall motion  abnormalities (see scoring  diagram/findings for description). Indeterminate diastolic filling due to  Austin Mcgee.  2. Right ventricular systolic function is normal. The right ventricular  size is normal. Tricuspid regurgitation signal is inadequate for assessing  PA pressure.  3. Left atrial size was mildly dilated.  4. The mitral valve is normal in structure. No evidence of mitral valve  regurgitation. No evidence of mitral stenosis.  5. The aortic valve is normal in structure. Aortic valve regurgitation is  not visualized. No aortic stenosis is present.  6. The inferior vena cava is dilated in size with <50% respiratory  variability, suggesting right atrial pressure of 15 mmHg.   Consultations:  cardiology  Discharge Exam: Vitals:   11/18/19 0514 11/18/19 0735  BP: 125/77 112/86  Pulse: 82 74  Resp: (!) 21 20  Temp: 98.4 F (36.9 C) 98.4 F (36.9 C)  SpO2: 93% 97%   Asking to be discharged Not willing to wait until cardiology sees him  He demonstrates capacity to leave AMA Discussed importance of f/u with PCP/cardiology  General: No acute distress. Cardiovascular:  Heart sounds show Jasmine Mcbeth regular rate, and rhythm. Lungs: Clear to auscultation bilaterally  Abdomen: Soft, nontender, nondistended  Neurological: Alert and oriented 3. Moves all extremities 4. Cranial nerves II through XII grossly intact. Skin: Warm and dry. No rashes or lesions. Extremities: No clubbing or cyanosis. No edema.   Discharge Instructions   Discharge Instructions    Call MD for:  difficulty breathing, headache or visual disturbances   Complete by: As directed    Call MD for:  extreme fatigue   Complete by: As directed    Call MD for:  hives   Complete by: As directed    Call MD for:  persistant dizziness or light-headedness   Complete by: As directed    Call MD for:  persistant nausea and vomiting   Complete by: As directed    Call MD for:  redness, tenderness, or signs of infection (pain, swelling, redness, odor or green/yellow discharge around incision site)   Complete by: As directed    Call MD for:  severe uncontrolled pain   Complete by: As directed    Call MD for:  temperature >100.4   Complete by: As directed    Diet - low sodium heart healthy   Complete by: As directed    Discharge instructions   Complete by: As directed    You were seen for chest pain and new heart failure.   You had Kaien Pezzullo catheterization which showed normal coronary arteries.  Your pump is significantly decreased to 35%.  We'll start you on losartan for your heart failure.  Please follow up with your cardiologist outpatient.  You'll need Jashanti Clinkscale repeat echo in [place] few weeks.  Stopping alcohol and cocaine use will be important for your overall health and your heart health.    Please follow with Kema Santaella primary care doctor outpatient.   You have diabetes, we'll start you on metformin for this.  Return for new, recurrent, or worsening symptoms.  Please ask your PCP to request records from this hospitalization so they know what was done and what the next steps will be.   Increase activity slowly    Complete by: As directed      Allergies as of 11/18/2019   No Known Allergies     Medication List    STOP taking these medications   aspirin 325 MG tablet Replaced by: aspirin 81 MG EC tablet     TAKE these medications   acetaminophen 500 MG tablet Commonly known as: TYLENOL Take 1,000 mg by mouth every 6 (six) hours as needed for mild pain.   aspirin 81 MG EC tablet Take 1 tablet (81 mg total) by mouth daily. Start taking on: November 19, 2019 Replaces: aspirin 325 MG tablet   atorvastatin 40 MG tablet Commonly known as: LIPITOR Take 1 tablet (40 mg total) by mouth daily at 6 PM.   blood glucose meter kit and supplies Kit Dispense based on patient and insurance preference. Use up to four times daily as directed. (FOR ICD-9 250.00, 250.01).   doxylamine (Sleep) 25 MG tablet Commonly known as: UNISOM Take 25 mg by mouth at bedtime as needed. Patient used this medication to aid  with sleeping.   losartan 25 MG tablet Commonly known as: Cozaar Take 1 tablet (25 mg total) by mouth daily.   metFORMIN 500 MG tablet Commonly known as: Glucophage Take 1 tablet (500 mg total) by mouth 2 (two) times daily with Janari Yamada meal.      No Known Allergies    The results of significant diagnostics from this hospitalization (including imaging, microbiology, ancillary and laboratory) are listed below for reference.    Significant Diagnostic Studies: DG Chest 2 View  Result Date: 11/16/2019 CLINICAL DATA:  Chest pain, shortness of breath for 1 hour. EXAM: CHEST - 2 VIEW COMPARISON:  01/12/2019 FINDINGS: Cardiomediastinal contours are enlarged. Central pulmonary vascular engorgement. Increased interstitial markings bilaterally. Subtle basilar opacities bilaterally. No signs of pleural effusion. Visualized skeletal structures are unremarkable. IMPRESSION: Findings suspicious for heart failure or volume overload. Patchy basilar opacities may represent atelectasis or developing infection.  Electronically Signed   By: Zetta Bills M.D.   On: 11/16/2019 10:11   CARDIAC CATHETERIZATION  Result Date: 11/17/2019 Austin Mcgee is Wilene Pharo 49 y.o. male  [id number] LOCATION:  FACILITY: [facility] PHYSICIAN: Quay Burow, M.D. 1971-03-28 DATE OF PROCEDURE:  11/17/2019 DATE OF DISCHARGE: CARDIAC CATHETERIZATION History obtained from chart review.49 y.o. male smoker with HTN and relatively recent cocaine use, presenting with severe chest pain, acute systolic HF and newly recognized severe cardiomyopathy with regional wall motion abnormalities, also with moderately elevated CO levels following exposure to fumes from Justise Ehmann house generator. Newly recognized DM (A1c 6.6%) and elevated creatinine (3.97, uncertain chronicity).  His troponins rose to the 400 level.  His EF was 35% with regional wall motion abnormality in the LAD distribution.  He presents now for diagnostic coronary angiography.   Mr. Townley has normal coronary arteries and normal filling pressures.  I think his troponin leak was related to demand ischemia.  He has Zaiya Annunziato nonischemic cardiomyopathy.  Guideline directed optimal medical therapy will be recommended.  The sheath was removed and Austin Mcgee TR band was placed on the right wrist to achieve patent hemostasis.  The patient left lab in stable condition. Quay Burow. MD, [facility] 11/17/2019 5:12 PM   DG CHEST PORT 1 VIEW  Result Date: 11/18/2019 CLINICAL DATA:  Shortness of breath. EXAM: PORTABLE CHEST 1 VIEW COMPARISON:  November 16, 2019. FINDINGS: Stable cardiomediastinal silhouette. No pneumothorax is noted. Minimal bibasilar subsegmental atelectasis is noted with minimal left pleural effusion. The visualized skeletal structures are unremarkable. IMPRESSION: Minimal bibasilar subsegmental atelectasis with minimal left pleural effusion. Electronically Signed   By: Marijo Conception M.D.   On: 11/18/2019 09:15   ECHOCARDIOGRAM COMPLETE  Result Date: 11/16/2019    ECHOCARDIOGRAM REPORT   Patient Name:   Austin Mcgee  Date of Exam: 11/16/2019 Medical Rec #:  [id number]    Height:       67.5 in Accession #:    [id number]   Weight:       180.0 lb Date of Birth:  1971/05/07     BSA:          1.944 m Patient Age:    82 years     BP:           124/95 mmHg Patient Gender: M            HR:           100 bpm. Exam Location:  Inpatient Procedure: 2D Echo, Cardiac Doppler and Color Doppler STAT ECHO Indications:    NSTEMI I21.4  History:  Patient has no prior history of Echocardiogram examinations.                 Arrythmias:non-specific Mcgee changes; Risk Factors:Current Smoker.  Sonographer:    Vickie Epley RDCS Referring Phys: [id number] JENNIFER YATES  Sonographer Comments: Refused definity. IMPRESSIONS  1. There is global left ventricular hypokinesis with disproportionately severe hypokinesis/akinesis of the mid-apical anterior, anteroseptal walls and apex. This suggests infarction in the LAD artery territory. Cannot exclude left ventricular apical thrombus (patient declined Definity contrast). Left ventricular ejection fraction, by estimation, is 25 to 30%. The left ventricle has severely decreased function. The left ventricle demonstrates regional wall motion abnormalities (see scoring diagram/findings for description). Indeterminate diastolic filling due to Austin Mcgee.  2. Right ventricular systolic function is normal. The right ventricular size is normal. Tricuspid regurgitation signal is inadequate for assessing PA pressure.  3. Left atrial size was mildly dilated.  4. The mitral valve is normal in structure. No evidence of mitral valve regurgitation. No evidence of mitral stenosis.  5. The aortic valve is normal in structure. Aortic valve regurgitation is not visualized. No aortic stenosis is present.  6. The inferior vena cava is dilated in size with <50% respiratory variability, suggesting right atrial pressure of 15 mmHg. FINDINGS  Left Ventricle: There is global left ventricular hypokinesis with disproportionately severe  hypokinesis/akinesis of the mid-apical anterior, anteroseptal walls and apex. This suggests infarction in the LAD artery territory. Cannot exclude left ventricular apical thrombus (patient declined Definity contrast). Left ventricular ejection fraction, by estimation, is 25 to 30%. The left ventricle has severely decreased function. The left ventricle demonstrates regional wall motion abnormalities. The  left ventricular internal cavity size was normal in size. There is no left ventricular hypertrophy. Indeterminate diastolic filling due to E-Pierina Schuknecht Mcgee.  LV Wall Scoring: The mid and distal anterior wall, mid and distal anterior septum, apical inferior segment, and apex are akinetic. The entire lateral wall, inferior wall, basal anteroseptal segment, mid inferoseptal segment, basal anterior segment, and basal inferoseptal segment are hypokinetic. Right Ventricle: The right ventricular size is normal. No increase in right ventricular wall thickness. Right ventricular systolic function is normal. Tricuspid regurgitation signal is inadequate for assessing PA pressure. Left Atrium: Left atrial size was mildly dilated. Right Atrium: Right atrial size was normal in size. Pericardium: There is no evidence of pericardial effusion. Mitral Valve: The mitral valve is normal in structure. Normal mobility of the mitral valve leaflets. No evidence of mitral valve regurgitation. No evidence of mitral valve stenosis. Tricuspid Valve: The tricuspid valve is normal in structure. Tricuspid valve regurgitation is trivial. No evidence of tricuspid stenosis. Aortic Valve: The aortic valve is normal in structure. Aortic valve regurgitation is not visualized. No aortic stenosis is present. Pulmonic Valve: The pulmonic valve was normal in structure. Pulmonic valve regurgitation is not visualized. No evidence of pulmonic stenosis. Aorta: The aortic root is normal in size and structure. Venous: The inferior vena cava is dilated in size with  less than 50% respiratory variability, suggesting right atrial pressure of 15 mmHg. IAS/Shunts: No atrial level shunt detected by color flow Doppler.  LEFT VENTRICLE PLAX 2D LVIDd:         5.20 cm      Diastology LVIDs:         4.50 cm      LV e' lateral:   4.13 cm/s LV PW:         1.00 cm      LV E/e' lateral: 10.7  LV IVS:        1.00 cm      LV e' medial:    6.09 cm/s LVOT diam:     2.20 cm      LV E/e' medial:  7.2 LV SV:         48 LV SV Index:   24 LVOT Area:     3.80 cm  LV Volumes (MOD) LV vol d, MOD A2C: 178.0 ml LV vol d, MOD A4C: 181.0 ml LV vol s, MOD A2C: 132.0 ml LV vol s, MOD A4C: 125.0 ml LV SV MOD A2C:     46.0 ml LV SV MOD A4C:     181.0 ml LV SV MOD BP:      48.4 ml RIGHT VENTRICLE RV S prime:     14.50 cm/s TAPSE (M-mode): 2.2 cm LEFT ATRIUM             Index       RIGHT ATRIUM           Index LA diam:        4.00 cm 2.06 cm/m  RA Area:     14.30 cm LA Vol (A2C):   47.4 ml 24.38 ml/m RA Volume:   40.30 ml  20.73 ml/m LA Vol (A4C):   39.0 ml 20.06 ml/m LA Biplane Vol: 43.7 ml 22.47 ml/m  AORTIC VALVE LVOT Vmax:   74.80 cm/s LVOT Vmean:  49.600 cm/s LVOT VTI:    0.125 m  AORTA Ao Root diam: 3.20 cm MITRAL VALVE MV Area (PHT): 5.54 cm    SHUNTS MV Decel Time: 137 msec    Systemic VTI:  0.12 m MV E velocity: 44.10 cm/s  Systemic Diam: 2.20 cm MV Brigido Mera velocity: 44.10 cm/s MV E/Ousmane Seeman ratio:  1.00 Mihai Croitoru MD Electronically signed by Sanda Klein MD Signature Date/Time: 11/16/2019/3:46:35 PM    Final     Microbiology: Recent Results (from the past 240 hour(s))  Respiratory Panel by RT PCR (Flu Brissa Asante&B, Covid) - Nasopharyngeal Swab     Status: None   Collection Time: 11/16/19 11:42 AM   Specimen: Nasopharyngeal Swab  Result Value Ref Range Status   SARS Coronavirus 2 by RT PCR NEGATIVE NEGATIVE Final    Comment: (NOTE) SARS-CoV-2 target nucleic acids are NOT DETECTED. The SARS-CoV-2 RNA is generally detectable in upper respiratoy specimens during the acute phase of infection. The  lowest concentration of SARS-CoV-2 viral copies this assay can detect is 131 copies/mL. Jamerson Vonbargen negative result does not preclude SARS-Cov-2 infection and should not be used as the sole basis for treatment or other patient management decisions. Carthel Castille negative result may occur with  improper specimen collection/handling, submission of specimen other than nasopharyngeal swab, presence of viral mutation(s) within the areas targeted by this assay, and inadequate number of viral copies (<131 copies/mL). Janari Gagner negative result must be combined with clinical observations, patient history, and epidemiological information. The expected result is Negative. Fact Sheet for Patients:  PinkCheek.be Fact Sheet for Healthcare Providers:  GravelBags.it This test is not yet ap proved or cleared by the [place] FDA and  has been authorized for detection and/or diagnosis of SARS-CoV-2 by FDA under an Emergency Use Authorization (EUA). This EUA will remain  in effect (meaning this test can be used) for the duration of the COVID-19 declaration under Section 564(b)(1) of the Act, 21 U.S.C. section 360bbb-3(b)(1), unless the authorization is terminated or revoked sooner.    Influenza Araina Butrick by PCR NEGATIVE NEGATIVE Final  Influenza B by PCR NEGATIVE NEGATIVE Final    Comment: (NOTE) The Xpert Xpress SARS-CoV-2/FLU/RSV assay is intended as an aid in  the diagnosis of influenza from Nasopharyngeal swab specimens and  should not be used as Tyneshia Stivers sole basis for treatment. Nasal washings and  aspirates are unacceptable for Xpert Xpress SARS-CoV-2/FLU/RSV  testing. Fact Sheet for Patients: PinkCheek.be Fact Sheet for Healthcare Providers: GravelBags.it This test is not yet approved or cleared by the [place] FDA and  has been authorized for detection and/or diagnosis of SARS-CoV-2 by  FDA under an Emergency  Use Authorization (EUA). This EUA will remain  in effect (meaning this test can be used) for the duration of the  Covid-19 declaration under Section 564(b)(1) of the Act, 21  U.S.C. section 360bbb-3(b)(1), unless the authorization is  terminated or revoked. Performed at [facility], [place] [address] [place], [place] [address]      Labs: Basic Metabolic Panel: Recent Labs  Lab 11/16/19 0913 11/16/19 1527 11/17/19 0929 11/18/19 0315  NA 134* 136 136 133*  K 5.0 3.8 3.7 3.2*  CL 99  --  101 103  CO2 21*  --  24 21*  GLUCOSE 123*  --  195* 172*  BUN 33*  --  26* 16  CREATININE 1.42*  --  1.42* 1.33*  CALCIUM 10.0  --  9.3 9.0  MG  --   --   --  1.8  PHOS  --   --   --  2.8   Liver Function Tests: Recent Labs  Lab 11/18/19 0315  AST 64*  ALT 72*  ALKPHOS 59  BILITOT 0.9  PROT 6.7  ALBUMIN 3.0*   No results for input(s): LIPASE, AMYLASE in the last 168 hours. No results for input(s): AMMONIA in the last 168 hours. CBC: Recent Labs  Lab 11/16/19 0913 11/16/19 1527 11/17/19 0929 11/18/19 0315  WBC 11.9*  --  7.0 6.3  HGB 13.0 13.3 13.0 12.4*  HCT 39.7 39.0 39.6 38.9*  MCV 88.8  --  89.6 89.8  PLT 346  --  318 276   Cardiac Enzymes: No results for input(s): CKTOTAL, CKMB, CKMBINDEX, TROPONINI in the last 168 hours. BNP: BNP (last 3 results) Recent Labs    11/16/19 1142  BNP 566.4*    ProBNP (last 3 results) No results for input(s): PROBNP in the last 8760 hours.  CBG: Recent Labs  Lab 11/17/19 2325 11/18/19 0616  GLUCAP 145* 186*       Signed:  Fayrene Helper MD.  [facility] 11/18/2019, 9:57 AM

## 2019-11-18 NOTE — Progress Notes (Addendum)
I managed to speak with the patient and his wife just before he left AMA. We discussed the findings of the cardiac catheterization and potential diagnostic and prognostic implications. 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 I have told him that I recommend treatment with a ACE inhibitor or ARB at least temporarily until we can confirm that left ventricular systolic function has recovered.  He does not appear to need diuretics at this time.  I also recommended an echocardiogram in 3 or 4 weeks and cardiology follow-up thereafter. I strongly recommended abstinence from cocaine since any future indulgence in this may be a life-threatening event. He did not appear to have any complications at his cardiac cath access site. I asked him to please be patient and wait until we can make arrangements for the follow-up testing and appointments, but I understand that he left AGAINST MEDICAL ADVICE shortly after that.  Thurmon Fair, MD, [facility] [phone] office [phone] pager

## 2019-11-18 NOTE — Progress Notes (Signed)
Pt refusing to wear tele. Pt also wants IV line removed. States he wishes to sign out AMA. [employer] paged. 2mg  Ativan given for poss DT's.  Will continue to monitor.  , RN

## 2019-11-18 NOTE — Progress Notes (Signed)
Normal saline drip at 64ml /hr infused from day shift till 0232 on 11/18/19.

## 2019-11-18 NOTE — Progress Notes (Signed)
Pt signed AMA paper and wife arrived with clothes. Pt ambulated to elevator.  Lacy Duverney, RN

## 2020-12-11 IMAGING — CR DG CHEST 2V
2 series · 2 of 2 positions shown · non-contrast
Comparison: 01/12/2019

CLINICAL DATA: Chest pain, shortness of breath for 1 hour.

EXAM:
CHEST - 2 VIEW

[chest lat]
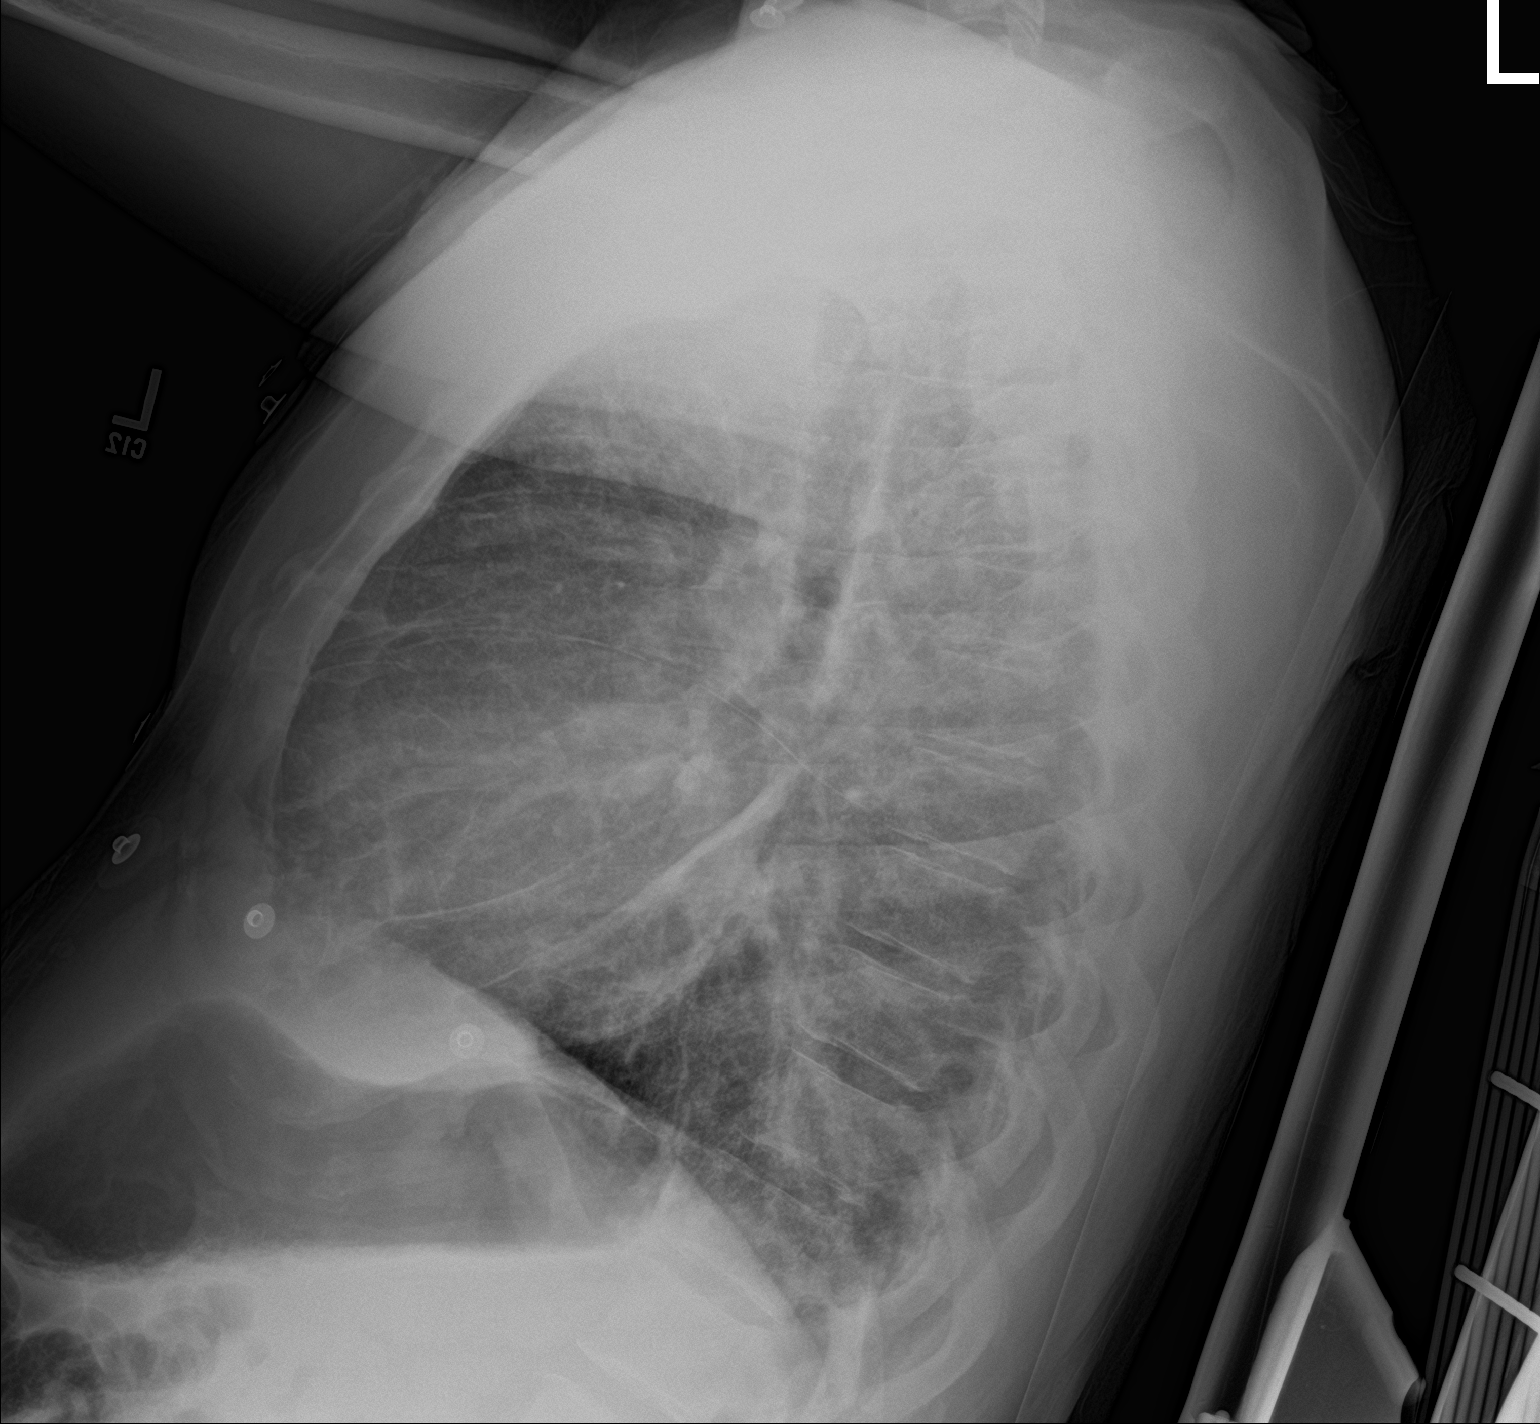

[chest ap]
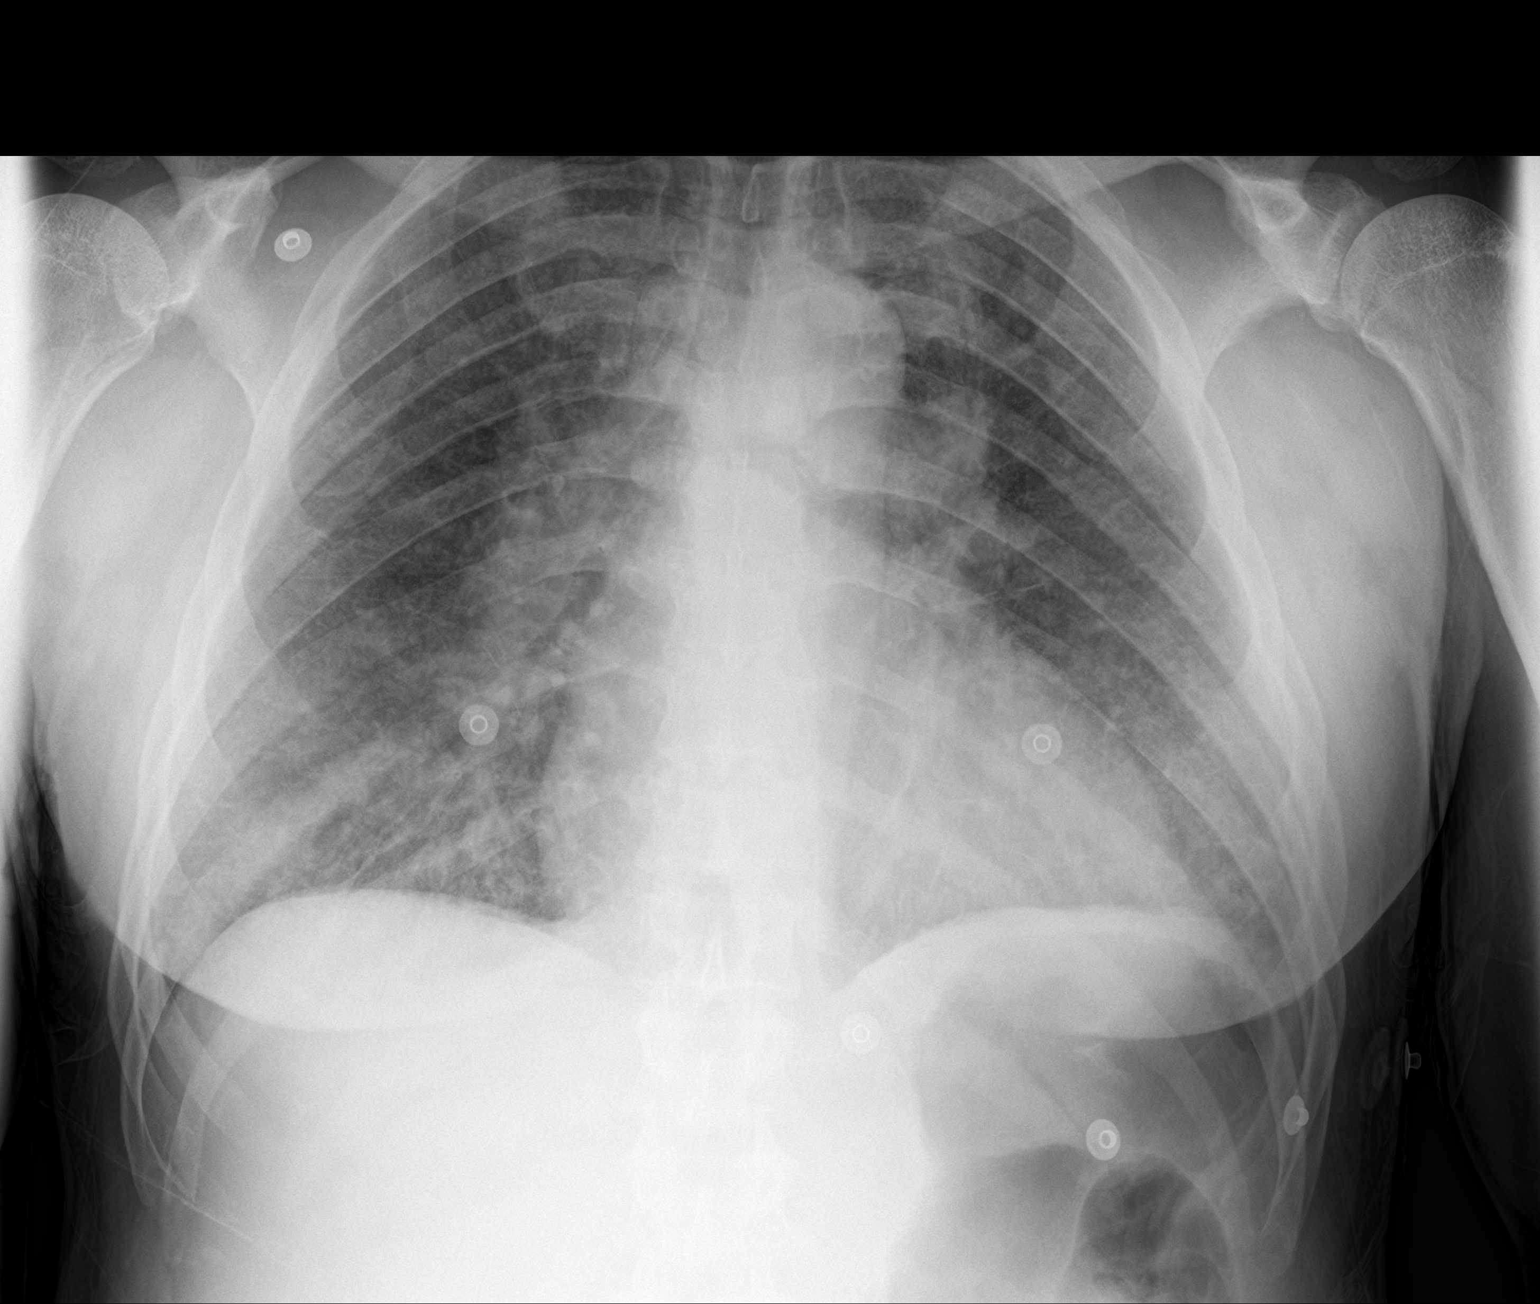

[2 of 2 positions shown; findings below may reference images not displayed]

FINDINGS: Cardiomediastinal contours are enlarged. Central pulmonary vascular
engorgement. Increased interstitial markings bilaterally.

Subtle basilar opacities bilaterally.

No signs of pleural effusion.

Visualized skeletal structures are unremarkable.
IMPRESSION: Findings suspicious for heart failure or volume overload.

Patchy basilar opacities may represent atelectasis or developing
infection.

## 2022-09-06 DIAGNOSIS — M129 Arthropathy, unspecified: Secondary | ICD-10-CM | POA: Diagnosis not present

## 2022-09-13 DIAGNOSIS — R918 Other nonspecific abnormal finding of lung field: Secondary | ICD-10-CM | POA: Diagnosis not present

## 2022-11-23 DIAGNOSIS — R748 Abnormal levels of other serum enzymes: Secondary | ICD-10-CM | POA: Diagnosis not present

## 2022-11-23 DIAGNOSIS — J449 Chronic obstructive pulmonary disease, unspecified: Secondary | ICD-10-CM | POA: Diagnosis not present

## 2022-11-23 DIAGNOSIS — Z79899 Other long term (current) drug therapy: Secondary | ICD-10-CM | POA: Diagnosis not present

## 2022-11-23 DIAGNOSIS — D869 Sarcoidosis, unspecified: Secondary | ICD-10-CM | POA: Diagnosis not present

## 2022-11-23 DIAGNOSIS — R590 Localized enlarged lymph nodes: Secondary | ICD-10-CM | POA: Diagnosis not present

## 2023-01-03 DIAGNOSIS — E119 Type 2 diabetes mellitus without complications: Secondary | ICD-10-CM | POA: Diagnosis not present

## 2023-01-03 DIAGNOSIS — F322 Major depressive disorder, single episode, severe without psychotic features: Secondary | ICD-10-CM | POA: Diagnosis not present

## 2023-01-03 DIAGNOSIS — F1910 Other psychoactive substance abuse, uncomplicated: Secondary | ICD-10-CM | POA: Diagnosis not present

## 2023-01-03 DIAGNOSIS — Z79899 Other long term (current) drug therapy: Secondary | ICD-10-CM | POA: Diagnosis not present

## 2023-01-05 DIAGNOSIS — Z79899 Other long term (current) drug therapy: Secondary | ICD-10-CM | POA: Diagnosis not present
